# Patient Record
Sex: Male | Born: 1935 | Race: White | Hispanic: Refuse to answer | Marital: Married | Smoking: Former smoker
Health system: Southern US, Community
[De-identification: ages and names within clinical notes are randomized; demographics above are authoritative.]

## PROBLEM LIST (undated history)

## (undated) DIAGNOSIS — I10 Essential (primary) hypertension: Secondary | ICD-10-CM

## (undated) DIAGNOSIS — N189 Chronic kidney disease, unspecified: Secondary | ICD-10-CM

## (undated) DIAGNOSIS — J189 Pneumonia, unspecified organism: Secondary | ICD-10-CM

## (undated) DIAGNOSIS — C801 Malignant (primary) neoplasm, unspecified: Secondary | ICD-10-CM

## (undated) DIAGNOSIS — I219 Acute myocardial infarction, unspecified: Secondary | ICD-10-CM

## (undated) DIAGNOSIS — J45909 Unspecified asthma, uncomplicated: Secondary | ICD-10-CM

## (undated) DIAGNOSIS — C8590 Non-Hodgkin lymphoma, unspecified, unspecified site: Secondary | ICD-10-CM

## (undated) DIAGNOSIS — I2510 Atherosclerotic heart disease of native coronary artery without angina pectoris: Secondary | ICD-10-CM

## (undated) DIAGNOSIS — M1990 Unspecified osteoarthritis, unspecified site: Secondary | ICD-10-CM

## (undated) HISTORY — PX: APPENDECTOMY: SHX54

## (undated) HISTORY — PX: JOINT REPLACEMENT: SHX530

## (undated) HISTORY — PX: TONSILLECTOMY: SUR1361

## (undated) HISTORY — PX: EYE SURGERY: SHX253

## (undated) HISTORY — PX: NEPHRECTOMY: SHX65

## (undated) HISTORY — PX: CARDIAC CATHETERIZATION: SHX172

---

## 2019-06-19 ENCOUNTER — Encounter: Payer: Self-pay | Admitting: Physical Therapy

## 2019-06-19 ENCOUNTER — Other Ambulatory Visit: Payer: Self-pay

## 2019-06-19 ENCOUNTER — Ambulatory Visit: Payer: No Typology Code available for payment source | Attending: Neurosurgery | Admitting: Physical Therapy

## 2019-06-19 VITALS — BP 141/63 | HR 63

## 2019-06-19 DIAGNOSIS — R29898 Other symptoms and signs involving the musculoskeletal system: Secondary | ICD-10-CM | POA: Diagnosis present

## 2019-06-19 DIAGNOSIS — G8929 Other chronic pain: Secondary | ICD-10-CM | POA: Insufficient documentation

## 2019-06-19 DIAGNOSIS — M5441 Lumbago with sciatica, right side: Secondary | ICD-10-CM | POA: Insufficient documentation

## 2019-06-19 DIAGNOSIS — R262 Difficulty in walking, not elsewhere classified: Secondary | ICD-10-CM | POA: Insufficient documentation

## 2019-06-19 DIAGNOSIS — M5442 Lumbago with sciatica, left side: Secondary | ICD-10-CM | POA: Insufficient documentation

## 2019-06-19 NOTE — Therapy (Signed)
[facility] [address]  [place], [place], [address] Phone: [phone]   Fax:  [phone]  Physical Therapy Evaluation  Patient Details  Name: Craig Werner MRN: [id number] Date of Birth: 02/02/1934 Referring Provider (PT): [name], MD   Encounter Date: 06/19/2019  PT End of Session - 06/19/19 1545    Visit Number  1    Number of Visits  15    Date for PT Re-Evaluation  08/07/19    Authorization Type  [facility], VL: 15    Authorization - Visit Number  1    Authorization - Number of Visits  15    PT Start Time  1401    PT Stop Time  1444    PT Time Calculation (min)  43 min    Activity Tolerance  Patient tolerated treatment well;Patient limited by pain    Behavior During Therapy  [facility] for tasks assessed/performed       History reviewed. No pertinent past medical history.  History reviewed. No pertinent surgical history.  Vitals:   06/19/19 1422  BP: (!) 141/63  Pulse: 63  SpO2: 96%     Subjective Assessment - 06/19/19 1404    Subjective  Patient reporting LBP for the past year. Pain came on insidiously with progressive worsening. Occurs across B LB with radiation down B LEs down to proximal lower legs. Reports intermittent N/T. Denies recent bowel or bladder changes. Pain worse in the AM, when getting up out of bed, and with difficulty walking in the beginning of the day. Has been using SPC for the past 3-4 months d/t pain. No better after seeing chiropractor.    Pertinent History  hx prostate and kidney CA in remission, hx MI,    Limitations  Sitting;Lifting;Standing;Walking;House hold activities    How long can you sit comfortably?  1-2 hours    How long can you stand comfortably?  15-20 min    How long can you walk comfortably?  0 min    Diagnostic tests  06/08/19 lumbar MRI: moderately severe lumbar spinal stenosis L4-5, small disc protrusion L5-S1,small synovial cyst at L2    Patient Stated Goals   "i want to be able to walk again"    Currently in Pain?  Yes    Pain Score  2     Pain Location  Back    Pain Orientation  Right;Left;Lower    Pain Descriptors / Indicators  --   unable to describe   Pain Type  Chronic pain    Pain Radiating Towards  down into B LEs         [facility] PT Assessment - 06/19/19 1413      Assessment   Medical Diagnosis  Spinal Stenosis of Lumbar Region without Neurogenic Claudication    Referring Provider (PT)  [name], MD    Onset Date/Surgical Date  06/18/18    Next MD Visit  patient not sure    Prior Therapy  yes- for hips      Precautions   Precautions  None   hx of prostate of kidney CA     Balance Screen   Has the patient fallen in the past 6 months  No    Has the patient had a decrease in activity level because of a fear of falling?   No    Is the patient reluctant to leave their home because of a fear of falling?   No  Home Environment   Living Environment  Private residence    Living Arrangements  Spouse/significant other    Available Help at Discharge  Family    Type of [place] to enter    Entrance Stairs-Number of Steps  [place] to live on main level with bedroom/bathroom    [place] - 4 wheels      Prior Function   Level of [place]   had difficulty cooking   Vocation  Retired    Leisure  hunting, fishing, Arts administrator   Overall Cognitive Status  Within Functional Limits for tasks assessed      Sensation   Light Touch  Appears Intact      Coordination   Gross Motor Movements are Fluid and Coordinated  No   limited by pain     Posture/Postural Control   Posture/Postural Control  Postural limitations    Postural Limitations  Rounded Shoulders;Forward head;Increased thoracic kyphosis      ROM / Strength   AROM / PROM / Strength  AROM;Strength      AROM   AROM Assessment Site  Lumbar     Lumbar Flexion  mid shin   mild pain   Lumbar Extension  mildly limited   moderat epain   Lumbar - Right Side Bend   jt line   moderate pain   Lumbar - Left Side Bend  jt line   moderate pain   Lumbar - Right Rotation  WFL   moderate pain   Lumbar - Left Rotation  WFL   moderate pain     Strength   Strength Assessment Site  Hip;Knee;Ankle    Right/Left Hip  Right;Left    Right Hip Flexion  4+/5    Right Hip ABduction  4-/5    Right Hip ADduction  4+/5    Left Hip Flexion  4+/5    Left Hip ABduction  4+/5    Left Hip ADduction  4+/5    Right/Left Knee  Right;Left    Right Knee Flexion  4/5    Right Knee Extension  4/5    Left Knee Flexion  4/5    Left Knee Extension  4/5    Right/Left Ankle  Right;Left    Right Ankle Dorsiflexion  4+/5    Right Ankle Plantar Flexion  4+/5    Left Ankle Dorsiflexion  4+/5    Left Ankle Plantar Flexion  4+/5      Flexibility   Soft Tissue Assessment /Muscle Length  yes    Hamstrings  B severely tight    Quadriceps  R hip flexor mod tight, L mildly tight    Piriformis  B mod tight piriformis with fig 4, less so with KTOS      Palpation   Palpation comment  atrophy of R glute med; TTP over R lumbar paraspinals at level of L3-5, TTP and increased soft tissue restriction of B proximal glutes and R piriformis      Ambulation/Gait   Assistive device  Straight cane    Gait Pattern  Step-to pattern;Step-through pattern;Decreased step length - right;Decreased step length - left;Lateral trunk lean to left;Lateral trunk lean to right;Lateral hip instability    Ambulation Surface  Level;Indoor    Gait velocity  decreased  Objective measurements completed on examination: See above findings.              PT Education - 06/19/19 1545    Education Details  prognosis, POC, HEP    Person(s) Educated  Patient    Methods  Explanation;Demonstration;Tactile cues;Verbal cues;Handout    Comprehension  Verbalized  understanding;Returned demonstration       PT Short Term Goals - 06/19/19 1553      PT SHORT TERM GOAL #1   Title  Patient to be independent with initial HEP.    Time  3    Period  Weeks    Status  New    Target Date  07/10/19        PT Long Term Goals - 06/19/19 1554      PT LONG TERM GOAL #1   Title  Patient to be independent with advanced HEP.    Time  7    Period  Weeks    Status  New    Target Date  08/07/19      PT LONG TERM GOAL #2   Title  Patient to demonstrate B hip strength >/=4+/5.    Time  7    Period  Weeks    Status  New    Target Date  08/07/19      PT LONG TERM GOAL #3   Title  Patient to demonstrate lumbar AROM WFL and without pain limiting.    Time  7    Period  Weeks    Status  New    Target Date  08/07/19      PT LONG TERM GOAL #4   Title  Patient to report 75% improvement in pain levels in AM.    Time  7    Period  Weeks    Status  New    Target Date  08/07/19      PT LONG TERM GOAL #5   Title  Patient to report tolerance of 30 min of walking without pain limiting.    Time  7    Period  Weeks    Status  New    Target Date  08/07/19             Plan - 06/19/19 1546    Clinical Impression Statement  Patient is a 86y/o M presenting to OPPT with c/o B LBP with radiation down B LEs for the past year. Radiation occurs down to proximal lower legs with intermittent N/T. Denies recent bowel or bladder changes. Pain worse in the AM, when getting up out of bed, and with difficulty walking in the beginning of the day. Has been using SPC for the past 3-4 months d/t pain. Patient today presenting with R hip abduction weakness, mildly limited but painful lumbar AROM, considerable tightness in B LEs, gait deviations, visible atrophy or R glute med, and TTP over R lumbar paraspinals, B proximal glutes, and R piriformis. Educated patient on gentle stretching HEP- patient reported understanding. Would benefit form skilled PT services 2x/week for 8  weeks to address aforementioned impairments.    Personal Factors and Comorbidities  Age;Comorbidity 3+;Time since onset of injury/illness/exacerbation;Fitness;Past/Current Experience    Comorbidities  CKD, HTN, LBP, HLD, R hip OA, hx prostate and kidney CA in remission    Examination-Activity Limitations  Bathing;Sit;Bed Mobility;Sleep;Bend;Squat;Caring for Others;Stairs;Carry;Stand;Continence;Toileting;Dressing;Transfers;Hygiene/Grooming;Lift;Locomotion Level;Reach Overhead    Examination-Participation Restrictions  Church;School;Cleaning;Shop;Community Activity;Driving;Yard Work;Interpersonal Relationship;Laundry;Meal Prep    Stability/Clinical Decision Making  Evolving/Moderate complexity    Clinical Decision Making  Moderate    Rehab Potential  Good    PT Frequency  2x / week    PT Duration  --   2x7 weeks   PT Treatment/Interventions  ADLs/Self Care Home Management;Canalith Repostioning;Cryotherapy;Moist Heat;Traction;Balance training;Therapeutic exercise;Therapeutic activities;Functional mobility training;Stair training;Gait training;DME Instruction;Neuromuscular re-education;Patient/family education;Manual techniques;Taping;Energy conservation;Dry needling;Passive range of motion    PT Next Visit Plan  FOTO; reassess HEP    Consulted and Agree with Plan of Care  Patient       Patient will benefit from skilled therapeutic intervention in order to improve the following deficits and impairments:  Abnormal gait, Decreased activity tolerance, Decreased strength, Increased fascial restricitons, Pain, Increased muscle spasms, Difficulty walking, Decreased range of motion, Improper body mechanics, Postural dysfunction, Impaired flexibility, Decreased balance  Visit Diagnosis: Chronic bilateral low back pain with bilateral sciatica  Difficulty in walking, not elsewhere classified  Impaired flexibility of lower extremity     Problem List There are no problems to display for this  patient.    Janene Harvey, PT, DPT 06/19/19 3:59 PM   [facility] [address]  [place], [place], [address] Phone: [phone]   Fax:  [phone]  Name: Craig Werner MRN: [id number] Date of Birth: [birth year]

## 2019-06-21 ENCOUNTER — Encounter: Payer: Self-pay | Admitting: Physical Therapy

## 2019-06-21 ENCOUNTER — Other Ambulatory Visit: Payer: Self-pay

## 2019-06-21 ENCOUNTER — Ambulatory Visit: Payer: No Typology Code available for payment source | Admitting: Physical Therapy

## 2019-06-21 VITALS — BP 131/66

## 2019-06-21 DIAGNOSIS — M5442 Lumbago with sciatica, left side: Secondary | ICD-10-CM | POA: Diagnosis not present

## 2019-06-21 DIAGNOSIS — G8929 Other chronic pain: Secondary | ICD-10-CM

## 2019-06-21 DIAGNOSIS — R262 Difficulty in walking, not elsewhere classified: Secondary | ICD-10-CM

## 2019-06-21 DIAGNOSIS — R29898 Other symptoms and signs involving the musculoskeletal system: Secondary | ICD-10-CM

## 2019-06-21 DIAGNOSIS — M5441 Lumbago with sciatica, right side: Secondary | ICD-10-CM

## 2019-06-21 NOTE — Therapy (Signed)
[facility] [address]  [place], [place], [address] Phone: [phone]   Fax:  [phone]  Physical Therapy Treatment  Patient Details  Name: Craig Werner MRN: [id number] Date of Birth: December 28, 1933 Referring Provider (PT): [name], MD   Encounter Date: 06/21/2019  PT End of Session - 06/21/19 1200    Visit Number  2    Number of Visits  15    Date for PT Re-Evaluation  08/07/19    Authorization Type  VA, VL: 15    Authorization - Visit Number  2    Authorization - Number of Visits  15    PT Start Time  1109    PT Stop Time  1152    PT Time Calculation (min)  43 min    Activity Tolerance  Patient tolerated treatment well    Behavior During Therapy  [facility] for tasks assessed/performed       History reviewed. No pertinent past medical history.  History reviewed. No pertinent surgical history.  Vitals:   06/21/19 1110  BP: 131/66  SpO2: 99%    Subjective Assessment - 06/21/19 1113    Subjective  Apologetic for being late- got caught in traffic. Having some soreness with his HEP exercises but has been trying to do them.    Pertinent History  hx prostate and kidney CA in remission, hx MI,    Diagnostic tests  06/08/19 lumbar MRI: moderately severe lumbar spinal stenosis L4-5, small disc protrusion L5-S1,small synovial cyst at L2    Patient Stated Goals  "i want to be able to walk again"    Currently in Pain?  No/denies         [facility] PT Assessment - 06/21/19 0001      Observation/Other Assessments   Focus on Therapeutic Outcomes (FOTO)   Lumbar: 47 (53% limited, 44% predicted)                   [facility] Adult PT Treatment/Exercise - 06/21/19 0001      Exercises   Exercises  Lumbar;Knee/Hip      Lumbar Exercises: Stretches   Passive Hamstring Stretch  Right;Left;1 rep;30 seconds    Passive Hamstring Stretch Limitations  supine with strap    Single Knee to Chest Stretch  Right;Left;1  rep;30 seconds    Single Knee to Chest Stretch Limitations  good tolerance    Hip Flexor Stretch  Right;Left;2 reps;30 seconds    Hip Flexor Stretch Limitations  mod thomas stretch with foot resting on 6" step on R, 4" step on L   R tighter   Figure 4 Stretch  1 rep;30 seconds;Supine;With overpressure    Figure 4 Stretch Limitations  B sides; to tolerance   cues to avoid spine rotation; L tighter     Lumbar Exercises: Aerobic   Nustep  L3x 70min (UEs/LEs)      Lumbar Exercises: Supine   Bridge  10 reps    Bridge Limitations  cues for core and glute contraction as well as correction to R knee inward collapse   limited ROM; stopped to stretch L HS d/t cramping            PT Education - 06/21/19 1153    Education Details  update & review of HEP    Person(s) Educated  Patient    Methods  Explanation;Demonstration;Tactile cues;Verbal cues;Handout    Comprehension  Verbalized understanding;Returned demonstration       PT Short  Term Goals - 06/21/19 1203      PT SHORT TERM GOAL #1   Title  Patient to be independent with initial HEP.    Time  3    Period  Weeks    Status  On-going    Target Date  07/10/19        PT Long Term Goals - 06/21/19 1204      PT LONG TERM GOAL #1   Title  Patient to be independent with advanced HEP.    Time  7    Period  Weeks    Status  On-going      PT LONG TERM GOAL #2   Title  Patient to demonstrate B hip strength >/=4+/5.    Time  7    Period  Weeks    Status  On-going      PT LONG TERM GOAL #3   Title  Patient to demonstrate lumbar AROM WFL and without pain limiting.    Time  7    Period  Weeks    Status  On-going      PT LONG TERM GOAL #4   Title  Patient to report 75% improvement in pain levels in AM.    Time  7    Period  Weeks    Status  On-going      PT LONG TERM GOAL #5   Title  Patient to report tolerance of 30 min of walking without pain limiting.    Time  7    Period  Weeks    Status  On-going             Plan - 06/21/19 1200    Clinical Impression Statement  Patient reporting "soreness" with HEP exercises. Thus, reviewed stretches for improved carryover. Patient noting that he forgot about figure 4 stretch, requiring additional demonstration and review. Demonstrated increased tightness on L LE with figure 4 stretch, but with increased tightness on R LE with hip flexor stretch. Able to initiate bridges with cues to contract core and glutes and correct valgus collapse of R LE. Plan to add banded resistance to this exercise in the future to improve patient's abduction strength here. Updated HEP with modified hip flexor stretch as it was well-tolerated today. Patient reported understanding and without complaints at end of session.    Comorbidities  CKD, HTN, LBP, HLD, R hip OA, hx prostate and kidney CA in remission    PT Treatment/Interventions  ADLs/Self Care Home Management;Canalith Repostioning;Cryotherapy;Moist Heat;Traction;Balance training;Therapeutic exercise;Therapeutic activities;Functional mobility training;Stair training;Gait training;DME Instruction;Neuromuscular re-education;Patient/family education;Manual techniques;Taping;Energy conservation;Dry needling;Passive range of motion    PT Next Visit Plan  progress LE stretching and hip strength    Consulted and Agree with Plan of Care  Patient       Patient will benefit from skilled therapeutic intervention in order to improve the following deficits and impairments:  Abnormal gait, Decreased activity tolerance, Decreased strength, Increased fascial restricitons, Pain, Increased muscle spasms, Difficulty walking, Decreased range of motion, Improper body mechanics, Postural dysfunction, Impaired flexibility, Decreased balance  Visit Diagnosis: Chronic bilateral low back pain with bilateral sciatica  Difficulty in walking, not elsewhere classified  Impaired flexibility of lower extremity     Problem List There are no  problems to display for this patient.    Janene Harvey, PT, DPT 06/21/19 12:05 PM   [facility] [address]  [address] [place], [place], [address] Phone: [phone]   Fax:  [phone]  Name: Craig Werner MRN: [id number] Date of Birth: [birth year]

## 2019-06-24 ENCOUNTER — Ambulatory Visit: Payer: No Typology Code available for payment source | Admitting: Physical Therapy

## 2019-06-24 ENCOUNTER — Other Ambulatory Visit: Payer: Self-pay

## 2019-06-24 ENCOUNTER — Encounter: Payer: Self-pay | Admitting: Physical Therapy

## 2019-06-24 VITALS — BP 142/68 | HR 84

## 2019-06-24 DIAGNOSIS — R262 Difficulty in walking, not elsewhere classified: Secondary | ICD-10-CM

## 2019-06-24 DIAGNOSIS — R29898 Other symptoms and signs involving the musculoskeletal system: Secondary | ICD-10-CM

## 2019-06-24 DIAGNOSIS — G8929 Other chronic pain: Secondary | ICD-10-CM

## 2019-06-24 DIAGNOSIS — M5442 Lumbago with sciatica, left side: Secondary | ICD-10-CM | POA: Diagnosis not present

## 2019-06-24 NOTE — Therapy (Signed)
[facility] [address]  [place], [place], [address] Phone: [phone]   Fax:  [phone]  Physical Therapy Treatment  Patient Details  Name: Craig Werner MRN: [id number] Date of Birth: 31-Aug-1933 Referring Provider (PT): [name], MD   Encounter Date: 06/24/2019  PT End of Session - 06/24/19 1004    Visit Number  3    Number of Visits  15    Date for PT Re-Evaluation  08/07/19    Authorization Type  VA, VL: 38    Authorization - Visit Number  3    Authorization - Number of Visits  15    PT Start Time  [phone]    PT Stop Time  0930    PT Time Calculation (min)  41 min    Activity Tolerance  Patient tolerated treatment well    Behavior During Therapy  [facility] for tasks assessed/performed       History reviewed. No pertinent past medical history.  History reviewed. No pertinent surgical history.  Vitals:   06/24/19 0850 06/24/19 0852  BP: (!) 142/68   Pulse: 99 84  SpO2: 95%     Subjective Assessment - 06/24/19 0853    Subjective  Feeling good. Exercises are alright, not great. Feels like a bit of a strain.    Pertinent History  hx prostate and kidney CA in remission, hx MI,    Diagnostic tests  06/08/19 lumbar MRI: moderately severe lumbar spinal stenosis L4-5, small disc protrusion L5-S1,small synovial cyst at L2    Patient Stated Goals  "i want to be able to walk again"    Currently in Pain?  No/denies                       [facility] Adult PT Treatment/Exercise - 06/24/19 0001      Ambulation/Gait   Ambulation Distance (Feet)  150 Feet    Assistive device  Straight cane    Gait Pattern  Step-to pattern;Step-through pattern;Decreased step length - right;Decreased step length - left;Lateral trunk lean to left;Lateral trunk lean to right;Lateral hip instability    Ambulation Surface  Level;Indoor    Gait velocity  decreased    Gait Comments  gait training with adjustment in [place] height  and practice using cane in R/L side and edu on cane sequencing for most appropriate pattern for max stability    most stable with SPC in L hand     Lumbar Exercises: Stretches   Passive Hamstring Stretch  Right;Left;1 rep;30 seconds    Passive Hamstring Stretch Limitations  supine with strap    Figure 4 Stretch  1 rep;30 seconds;Supine;With overpressure    Figure 4 Stretch Limitations  B sides; to tolerance      Lumbar Exercises: Aerobic   Nustep  L3x 65min (UEs/LEs)   spO2 93%, 98bpm at 3 min mark     Lumbar Exercises: Supine   Clam  10 reps    Clam Limitations  with yellow TB   cues to increase ROM on R   Bridge with clamshell  10 reps   yellow TB above knees     Knee/Hip Exercises: Sidelying   Clams  x10 each side    cues to avoid trunk rotation; discontinued d/t R hip pain             PT Education - 06/24/19 0938    Education Details  update to HEP    Person(s)  Educated  Patient    Methods  Explanation;Demonstration;Tactile cues;Verbal cues;Handout    Comprehension  Verbalized understanding;Returned demonstration       PT Short Term Goals - 06/21/19 1203      PT SHORT TERM GOAL #1   Title  Patient to be independent with initial HEP.    Time  3    Period  Weeks    Status  On-going    Target Date  07/10/19        PT Long Term Goals - 06/21/19 1204      PT LONG TERM GOAL #1   Title  Patient to be independent with advanced HEP.    Time  7    Period  Weeks    Status  On-going      PT LONG TERM GOAL #2   Title  Patient to demonstrate B hip strength >/=4+/5.    Time  7    Period  Weeks    Status  On-going      PT LONG TERM GOAL #3   Title  Patient to demonstrate lumbar AROM WFL and without pain limiting.    Time  7    Period  Weeks    Status  On-going      PT LONG TERM GOAL #4   Title  Patient to report 75% improvement in pain levels in AM.    Time  7    Period  Weeks    Status  On-going      PT LONG TERM GOAL #5   Title  Patient to report  tolerance of 30 min of walking without pain limiting.    Time  7    Period  Weeks    Status  On-going            Plan - 06/24/19 1004    Clinical Impression Statement  Patient without new complaints today. Began session with adjustment of SPC height and educated patient on proper cane sequencing to avoid fall. Patient performed gait training with practice using SPC in R and L sides, with L side ultimately demonstrating more stability and better benefit to R LE which is more painful. Continued with review of LE stretching with minor cues for form. Patient demonstrated hip strengthening ther-ex for focus on hip rotators and abductors. Patient reported muscle fatigue and required cues to increase ROM on R LE consistently. Updated HEP with supine clamshell as this was well-tolerated today. Advised patient to practice new SPC sequencing pattern at home before using it out in the community in order to avoid LOB. Patient reported understanding and without complaints at end of session.    Comorbidities  CKD, HTN, LBP, HLD, R hip OA, hx prostate and kidney CA in remission    PT Treatment/Interventions  ADLs/Self Care Home Management;Canalith Repostioning;Cryotherapy;Moist Heat;Traction;Balance training;Therapeutic exercise;Therapeutic activities;Functional mobility training;Stair training;Gait training;DME Instruction;Neuromuscular re-education;Patient/family education;Manual techniques;Taping;Energy conservation;Dry needling;Passive range of motion    PT Next Visit Plan  progress LE stretching and hip strength    Consulted and Agree with Plan of Care  Patient       Patient will benefit from skilled therapeutic intervention in order to improve the following deficits and impairments:  Abnormal gait, Decreased activity tolerance, Decreased strength, Increased fascial restricitons, Pain, Increased muscle spasms, Difficulty walking, Decreased range of motion, Improper body mechanics, Postural dysfunction,  Impaired flexibility, Decreased balance  Visit Diagnosis: Chronic bilateral low back pain with bilateral sciatica  Difficulty in walking, not elsewhere classified  Impaired flexibility of  lower extremity     Problem List There are no problems to display for this patient.    Janene Harvey, PT, DPT 06/24/19 10:19 AM   [facility] [address]  [place], [place], [address] Phone: [phone]   Fax:  [phone]  Name: COLUM SERRETTE MRN: [id number] Date of Birth: 1934-01-28

## 2019-06-26 ENCOUNTER — Encounter: Payer: Self-pay | Admitting: Physical Therapy

## 2019-06-26 ENCOUNTER — Ambulatory Visit: Payer: No Typology Code available for payment source | Admitting: Physical Therapy

## 2019-06-26 ENCOUNTER — Other Ambulatory Visit: Payer: Self-pay

## 2019-06-26 VITALS — BP 149/70 | HR 90

## 2019-06-26 DIAGNOSIS — R262 Difficulty in walking, not elsewhere classified: Secondary | ICD-10-CM

## 2019-06-26 DIAGNOSIS — R29898 Other symptoms and signs involving the musculoskeletal system: Secondary | ICD-10-CM

## 2019-06-26 DIAGNOSIS — M5442 Lumbago with sciatica, left side: Secondary | ICD-10-CM

## 2019-06-26 DIAGNOSIS — G8929 Other chronic pain: Secondary | ICD-10-CM

## 2019-06-26 NOTE — Therapy (Signed)
[facility] [address]  [place], [place], [address] Phone: [phone]   Fax:  [phone]  Physical Therapy Treatment  Patient Details  Name: Craig Werner MRN: [id number] Date of Birth: 12-Apr-1934 Referring Provider (PT): [name], MD   Encounter Date: 06/26/2019  PT End of Session - 06/26/19 1355    Visit Number  4    Number of Visits  15    Date for PT Re-Evaluation  08/07/19    Authorization Type  VA, VL: 15    Authorization - Visit Number  4    Authorization - Number of Visits  15    PT Start Time  [id number]    PT Stop Time  [id number]    PT Time Calculation (min)  42 min    Activity Tolerance  Patient tolerated treatment well    Behavior During Therapy  [facility] for tasks assessed/performed       History reviewed. No pertinent past medical history.  History reviewed. No pertinent surgical history.  Vitals:   06/26/19 1312  BP: (!) 149/70  Pulse: 90  SpO2: 98%    Subjective Assessment - 06/26/19 1308    Subjective  Back is feeling a little better today than it was. Has tried to walk with the [facility] using an alternating pattern as instructed, but still feeling like it is challenging.    Pertinent History  hx prostate and kidney CA in remission, hx MI,    Diagnostic tests  06/08/19 lumbar MRI: moderately severe lumbar spinal stenosis L4-5, small disc protrusion L5-S1,small synovial cyst at L2    Patient Stated Goals  "i want to be able to walk again"    Currently in Pain?  No/denies                       [facility] Adult PT Treatment/Exercise - 06/26/19 0001      Ambulation/Gait   Ambulation Distance (Feet)  90 Feet    Assistive device  Straight cane    Gait Pattern  Step-through pattern;Decreased step length - right;Decreased step length - left;Lateral trunk lean to left;Lateral trunk lean to right;Lateral hip instability    Ambulation Surface  Level;Indoor    Gait velocity  decreased    Gait  Comments  gait training with SPC in L hand with cues for cane sequencing and CGA/min A requred intermittent to assist with balance recovery      Lumbar Exercises: Aerobic   Nustep  L3x 3min (LEs only)      Lumbar Exercises: Supine   Bridge with clamshell  10 reps   2x10; yellow TB around knees     Knee/Hip Exercises: Stretches   Hip Flexor Stretch  Right;Left;1 rep;30 seconds    Piriformis Stretch  Right;Left;1 rep;30 seconds    Piriformis Stretch Limitations  supine figure 4 to tolerance      Knee/Hip Exercises: Seated   Sit to Sand  2 sets;5 reps;without UE support   5x with ball squeeze, 5x with yellow TB above knees            PT Education - 06/26/19 1355    Education Details  update to HEP; advised to omit supine hip flexor stretch    Person(s) Educated  Patient    Methods  Explanation;Demonstration;Tactile cues;Verbal cues;Handout    Comprehension  Verbalized understanding;Returned demonstration       PT Short Term Goals - 06/26/19 1402  PT SHORT TERM GOAL #1   Title  Patient to be independent with initial HEP.    Time  3    Period  Weeks    Status  Achieved    Target Date  07/10/19        PT Long Term Goals - 06/21/19 1204      PT LONG TERM GOAL #1   Title  Patient to be independent with advanced HEP.    Time  7    Period  Weeks    Status  On-going      PT LONG TERM GOAL #2   Title  Patient to demonstrate B hip strength >/=4+/5.    Time  7    Period  Weeks    Status  On-going      PT LONG TERM GOAL #3   Title  Patient to demonstrate lumbar AROM WFL and without pain limiting.    Time  7    Period  Weeks    Status  On-going      PT LONG TERM GOAL #4   Title  Patient to report 75% improvement in pain levels in AM.    Time  7    Period  Weeks    Status  On-going      PT LONG TERM GOAL #5   Title  Patient to report tolerance of 30 min of walking without pain limiting.    Time  7    Period  Weeks    Status  On-going             Plan - 06/26/19 1356    Clinical Impression Statement  Patient reporting good improvement in LBP today. Still noting difficulty with SPC sequencing using alternating step-through pattern as instructed at last session. Reviewed gait training with SPC in L hand, with cues for cane sequencing and CGA/min A required intermittently to assist with balance recovery. Patient demonstrating much improved gait speed and continuity of stepping with this pattern, despite being slightly off balance. Advised patient to continue practicing this walking pattern at home first to improve stability. Patient reported understanding. Continued to work on bridges with resistance in abduction with patient still demonstrating valgus collapse, worse on R. Initiated STS with patient requiring cues for set up and to avoid pushing bottom back posteriorly and locking out knees before standing up. Patient required pushing off from legs, but with good response to cues. Updated HEP with sitting hip flexor stretch, omitting supine version d/t patient's report of difficulty. Patient reported understanding-. No complaints at end of session.    Comorbidities  CKD, HTN, LBP, HLD, R hip OA, hx prostate and kidney CA in remission    PT Treatment/Interventions  ADLs/Self Care Home Management;Canalith Repostioning;Cryotherapy;Moist Heat;Traction;Balance training;Therapeutic exercise;Therapeutic activities;Functional mobility training;Stair training;Gait training;DME Instruction;Neuromuscular re-education;Patient/family education;Manual techniques;Taping;Energy conservation;Dry needling;Passive range of motion    PT Next Visit Plan  progress LE stretching and hip strength; STS    Consulted and Agree with Plan of Care  Patient       Patient will benefit from skilled therapeutic intervention in order to improve the following deficits and impairments:  Abnormal gait, Decreased activity tolerance, Decreased strength, Increased fascial  restricitons, Pain, Increased muscle spasms, Difficulty walking, Decreased range of motion, Improper body mechanics, Postural dysfunction, Impaired flexibility, Decreased balance  Visit Diagnosis: Chronic bilateral low back pain with bilateral sciatica  Difficulty in walking, not elsewhere classified  Impaired flexibility of lower extremity     Problem List There are  no problems to display for this patient.    Janene Harvey, PT, DPT 06/26/19 2:08 PM   [facility] [address]  [place], [place], [address] Phone: [phone]   Fax:  [phone]  Name: Craig Werner MRN: [id number] Date of Birth: 04-07-1934

## 2019-07-01 ENCOUNTER — Other Ambulatory Visit: Payer: Self-pay

## 2019-07-01 ENCOUNTER — Ambulatory Visit: Payer: No Typology Code available for payment source | Attending: Neurosurgery | Admitting: Physical Therapy

## 2019-07-01 ENCOUNTER — Encounter: Payer: Self-pay | Admitting: Physical Therapy

## 2019-07-01 VITALS — BP 137/63 | HR 75

## 2019-07-01 DIAGNOSIS — M5442 Lumbago with sciatica, left side: Secondary | ICD-10-CM | POA: Diagnosis not present

## 2019-07-01 DIAGNOSIS — R262 Difficulty in walking, not elsewhere classified: Secondary | ICD-10-CM | POA: Insufficient documentation

## 2019-07-01 DIAGNOSIS — M5441 Lumbago with sciatica, right side: Secondary | ICD-10-CM | POA: Diagnosis present

## 2019-07-01 DIAGNOSIS — R29898 Other symptoms and signs involving the musculoskeletal system: Secondary | ICD-10-CM | POA: Diagnosis present

## 2019-07-01 DIAGNOSIS — G8929 Other chronic pain: Secondary | ICD-10-CM | POA: Diagnosis present

## 2019-07-01 NOTE — Therapy (Signed)
[facility] [address]  [place], [place], [address] Phone: [phone]   Fax:  [phone]  Physical Therapy Treatment  Patient Details  Name: Craig Werner MRN: [id number] Date of Birth: 10-May-1934 Referring Provider (PT): [name], MD   Encounter Date: 07/01/2019  PT End of Session - 07/01/19 1150    Visit Number  5    Number of Visits  15    Date for PT Re-Evaluation  08/07/19    Authorization Type  VA, VL: 10    Authorization - Visit Number  5    Authorization - Number of Visits  15    PT Start Time  1100    PT Stop Time  [id number]    PT Time Calculation (min)  44 min    Activity Tolerance  Patient tolerated treatment well    Behavior During Therapy  [facility] for tasks assessed/performed       History reviewed. No pertinent past medical history.  History reviewed. No pertinent surgical history.  Vitals:   07/01/19 1101  BP: 137/63  Pulse: 75  SpO2: 96%    Subjective Assessment - 07/01/19 1104    Subjective  Back has been feeling a little bit better. Notes that it is hard to switch using his cane in his L hand while practicing at home.    Pertinent History  hx prostate and kidney CA in remission, hx MI,    Diagnostic tests  06/08/19 lumbar MRI: moderately severe lumbar spinal stenosis L4-5, small disc protrusion L5-S1,small synovial cyst at L2    Patient Stated Goals  "i want to be able to walk again"    Currently in Pain?  No/denies                       [facility] Adult PT Treatment/Exercise - 07/01/19 0001      Ambulation/Gait   Ambulation Distance (Feet)  180 Feet    Assistive device  Straight cane    Gait Pattern  Step-through pattern;Decreased step length - right;Decreased step length - left;Lateral trunk lean to left;Lateral trunk lean to right;Lateral hip instability    Ambulation Surface  Level;Indoor    Gait velocity  decreased    Gait Comments  gait training with SPC in L  hand practicing cane sequencing and cane placement   shortened cane slightly for improved stability     Lumbar Exercises: Stretches   Hip Flexor Stretch  Right;Left;30 seconds;1 rep    Hip Flexor Stretch Limitations  sitting off side of chair   slight cues for positioning   Standing Extension  5 reps    Standing Extension Limitations  perfoming with hands against wall and with supervision   pr reporting relief   Other Lumbar Stretch Exercise  sitting thoracic extension 5x sitting on pillow   report of stretch in anterior hips   Other Lumbar Stretch Exercise  lumbar prayer stretch 5x5" forward & to either side- discontonued d/t pt reporting no stretch      Lumbar Exercises: Aerobic   Stationary Bike       Nustep  L4x 38min (LEs only)      Knee/Hip Exercises: Standing   Hip Abduction  Stengthening;Right;Left;1 set;10 reps;Knee straight    Abduction Limitations  at counter; limited ROM, more difficulty on R LE    Hip Extension  Stengthening;Right;Left;1 set;10 reps;Knee straight    Extension Limitations  at counter; limited ROM & difficulty maintaining  straight knees    Functional Squat  1 set;10 reps    Functional Squat Limitations  mini squat at counter   cues to shift hips back     Knee/Hip Exercises: Seated   Sit to Sand  5 reps;without UE support;1 set   much improved control            PT Education - 07/01/19 1149    Education Details  update & review of HEP for improved understanding    Person(s) Educated  Patient    Methods  Explanation;Demonstration;Tactile cues;Verbal cues;Handout    Comprehension  Verbalized understanding;Returned demonstration       PT Short Term Goals - 06/26/19 1402      PT SHORT TERM GOAL #1   Title  Patient to be independent with initial HEP.    Time  3    Period  Weeks    Status  Achieved    Target Date  07/10/19        PT Long Term Goals - 06/21/19 1204      PT LONG TERM GOAL #1   Title  Patient to be independent with  advanced HEP.    Time  7    Period  Weeks    Status  On-going      PT LONG TERM GOAL #2   Title  Patient to demonstrate B hip strength >/=4+/5.    Time  7    Period  Weeks    Status  On-going      PT LONG TERM GOAL #3   Title  Patient to demonstrate lumbar AROM WFL and without pain limiting.    Time  7    Period  Weeks    Status  On-going      PT LONG TERM GOAL #4   Title  Patient to report 75% improvement in pain levels in AM.    Time  7    Period  Weeks    Status  On-going      PT LONG TERM GOAL #5   Title  Patient to report tolerance of 30 min of walking without pain limiting.    Time  7    Period  Weeks    Status  On-going            Plan - 07/01/19 1150    Clinical Impression Statement  Patient reporting continued improvement in LBP, but still noticing some difficulty when practicing using [place] in L hand while at home. Reviewed HEP handouts for improved compliance and understanding, with patient only requiring minor cues for positioning with sitting hip flexor stretch. Continued with gait training with SPC height lowered for improved stability. Patient still voluntarily using step-to pattern, leading with R LE but able to switch to reciprocal alternating pattern when prompted. Introduced standing hip strengthening ther-ex with patient requiring cues for posture and form. Reported 3/10 tightness in back after performing standing ther-ex, with most relief with standing lumbar extension stretch. Demonstrated good improvement in control and form with STS today, thus this was added into HEP. Patient reported understanding. No complaints at end of session. Patient demonstrating good progress in exercise tolerance.    Comorbidities  CKD, HTN, LBP, HLD, R hip OA, hx prostate and kidney CA in remission    PT Treatment/Interventions  ADLs/Self Care Home Management;Canalith Repostioning;Cryotherapy;Moist Heat;Traction;Balance training;Therapeutic exercise;Therapeutic  activities;Functional mobility training;Stair training;Gait training;DME Instruction;Neuromuscular re-education;Patient/family education;Manual techniques;Taping;Energy conservation;Dry needling;Passive range of motion    PT Next Visit Plan  progress LE  stretching and hip strength; STS    Consulted and Agree with Plan of Care  Patient       Patient will benefit from skilled therapeutic intervention in order to improve the following deficits and impairments:  Abnormal gait, Decreased activity tolerance, Decreased strength, Increased fascial restricitons, Pain, Increased muscle spasms, Difficulty walking, Decreased range of motion, Improper body mechanics, Postural dysfunction, Impaired flexibility, Decreased balance  Visit Diagnosis: Chronic bilateral low back pain with bilateral sciatica  Difficulty in walking, not elsewhere classified  Impaired flexibility of lower extremity     Problem List There are no problems to display for this patient.    Janene Harvey, PT, DPT 07/01/19 11:57 AM   [facility] [address]  [place], [place], [address] Phone: [phone]   Fax:  [phone]  Name: Craig Werner MRN: [id number] Date of Birth: 1933/07/05

## 2019-07-03 ENCOUNTER — Encounter: Payer: Self-pay | Admitting: Physical Therapy

## 2019-07-03 ENCOUNTER — Other Ambulatory Visit: Payer: Self-pay

## 2019-07-03 ENCOUNTER — Ambulatory Visit: Payer: No Typology Code available for payment source | Admitting: Physical Therapy

## 2019-07-03 VITALS — BP 139/68 | HR 75

## 2019-07-03 DIAGNOSIS — G8929 Other chronic pain: Secondary | ICD-10-CM

## 2019-07-03 DIAGNOSIS — R29898 Other symptoms and signs involving the musculoskeletal system: Secondary | ICD-10-CM

## 2019-07-03 DIAGNOSIS — M5442 Lumbago with sciatica, left side: Secondary | ICD-10-CM

## 2019-07-03 DIAGNOSIS — R262 Difficulty in walking, not elsewhere classified: Secondary | ICD-10-CM

## 2019-07-03 NOTE — Therapy (Signed)
[facility] [address]  [place], [place], [address] Phone: [phone]   Fax:  [phone]  Physical Therapy Treatment  Patient Details  Name: Craig Werner MRN: [id number] Date of Birth: Oct 24, 1933 Referring Provider (PT): [name], MD   Encounter Date: 07/03/2019  PT End of Session - 07/03/19 1151    Visit Number  6    Number of Visits  15    Date for PT Re-Evaluation  08/07/19    Authorization Type  VA, VL: 107    Authorization - Visit Number  6    Authorization - Number of Visits  15    PT Start Time  [id number]    PT Stop Time  [id number]    PT Time Calculation (min)  48 min    Activity Tolerance  Patient tolerated treatment well;Patient limited by pain    Behavior During Therapy  [facility] for tasks assessed/performed       History reviewed. No pertinent past medical history.  History reviewed. No pertinent surgical history.  Vitals:   07/03/19 1058  BP: 139/68  Pulse: 75  SpO2: 100%    Subjective Assessment - 07/03/19 1100    Subjective  Not much is new. Finally got an appointment to get a COVID vaccine. Has an appointment for spinal injections on 07/12/19. Notes that he is able to walk with less pain on average, but pain still fluctuates.    Pertinent History  hx prostate and kidney CA in remission, hx MI,    Diagnostic tests  06/08/19 lumbar MRI: moderately severe lumbar spinal stenosis L4-5, small disc protrusion L5-S1,small synovial cyst at L2    Patient Stated Goals  "i want to be able to walk again"    Currently in Pain?  No/denies                       [facility] Adult PT Treatment/Exercise - 07/03/19 0001      Lumbar Exercises: Stretches   Piriformis Stretch  Right;Left;30 seconds;2 reps    Piriformis Stretch Limitations  sitting KTOS    Figure 4 Stretch  30 seconds;With overpressure;Seated;2 reps    Figure 4 Stretch Limitations  B sides; to tolerance      Lumbar Exercises: Aerobic    Nustep  L4x 66min (LEs only)      Knee/Hip Exercises: Standing   Hip Abduction  Stengthening;Right;Left;10 reps;Knee straight;2 sets    Abduction Limitations  at counter; 1#   lateral trunk lean; cues to increase eccentric control   Hip Extension  Stengthening;Right;Left;1 set;10 reps;Knee straight    Extension Limitations  at counter top; 1#   cues to depress shoulders and maintain straight knee   Other Standing Knee Exercises  R LE hip hike at counter top on 6" step x5 each side with CGA   c/o B hip fatigue   Other Standing Knee Exercises  sidestepping with yellow TB around ankles 2 sets of 2x length of counter   small steps and lateral trunk lean     Manual Therapy   Manual Therapy  Soft tissue mobilization;Myofascial release    Manual therapy comments  L sidelying    Soft tissue mobilization  STM to R proximal glutes, proximal lateral HS and along ITB- TTP throughout and palpable soft tissue restriction    Myofascial Release  manual TPR to R proximal glutes and proximal lateral HS- many palpable and tender trigger points  PT Education - 07/03/19 1151    Education Details  advised patient to continue performing LE stretches to avoid severe muscle soreness    Person(s) Educated  Patient    Methods  Explanation;Demonstration;Tactile cues;Verbal cues    Comprehension  Verbalized understanding       PT Short Term Goals - 06/26/19 1402      PT SHORT TERM GOAL #1   Title  Patient to be independent with initial HEP.    Time  3    Period  Weeks    Status  Achieved    Target Date  07/10/19        PT Long Term Goals - 06/21/19 1204      PT LONG TERM GOAL #1   Title  Patient to be independent with advanced HEP.    Time  7    Period  Weeks    Status  On-going      PT LONG TERM GOAL #2   Title  Patient to demonstrate B hip strength >/=4+/5.    Time  7    Period  Weeks    Status  On-going      PT LONG TERM GOAL #3   Title  Patient to demonstrate lumbar  AROM WFL and without pain limiting.    Time  7    Period  Weeks    Status  On-going      PT LONG TERM GOAL #4   Title  Patient to report 75% improvement in pain levels in AM.    Time  7    Period  Weeks    Status  On-going      PT LONG TERM GOAL #5   Title  Patient to report tolerance of 30 min of walking without pain limiting.    Time  7    Period  Weeks    Status  On-going            Plan - 07/03/19 1152    Clinical Impression Statement  Patient without new complaints this AM. Noting that he is able to walk with less pain on average than when he first began therapy. Worked on continuing standing LE strengthening ther-ex at [place] today. Patient performed hip abduction and extension with addition of light ankle weights. Demonstrated lateral and anterior trunk lean and reported pain in L buttock after completing these exercises, thus allowed patient a sitting rest break and had patient perform sitting hip stretches to relieve pain. Patient reported fatigue in B hips after sidestepping with banded resistance, which was again relieved with sitting hip stretching. Initiated hip hiking with patient requiring CGA and quickly fatiguing with this exercise. Ended session with STM and TPR to R proximal glutes, proximal lateral HS, and along ITB. Patient demonstrated several palpable and tender trigger points in these muscle groups. Patient declined modalities at end of session, and with no further complaints upon leaving.    Comorbidities  CKD, HTN, LBP, HLD, R hip OA, hx prostate and kidney CA in remission    PT Treatment/Interventions  ADLs/Self Care Home Management;Canalith Repostioning;Cryotherapy;Moist Heat;Traction;Balance training;Therapeutic exercise;Therapeutic activities;Functional mobility training;Stair training;Gait training;DME Instruction;Neuromuscular re-education;Patient/family education;Manual techniques;Taping;Energy conservation;Dry needling;Passive range of motion    PT  Next Visit Plan  progress LE stretching and hip strength; STS    Consulted and Agree with Plan of Care  Patient       Patient will benefit from skilled therapeutic intervention in order to improve the following deficits and impairments:  Abnormal gait,  Decreased activity tolerance, Decreased strength, Increased fascial restricitons, Pain, Increased muscle spasms, Difficulty walking, Decreased range of motion, Improper body mechanics, Postural dysfunction, Impaired flexibility, Decreased balance  Visit Diagnosis: Chronic bilateral low back pain with bilateral sciatica  Difficulty in walking, not elsewhere classified  Impaired flexibility of lower extremity     Problem List There are no problems to display for this patient.    Janene Harvey, PT, DPT 07/03/19 11:58 AM   [facility] [address]  [place], [place], [address] Phone: [phone]   Fax:  [phone]  Name: Craig Werner MRN: [id number] Date of Birth: 05/11/1934

## 2019-07-08 ENCOUNTER — Encounter: Payer: Self-pay | Admitting: Physical Therapy

## 2019-07-08 ENCOUNTER — Other Ambulatory Visit: Payer: Self-pay

## 2019-07-08 ENCOUNTER — Ambulatory Visit: Payer: No Typology Code available for payment source | Admitting: Physical Therapy

## 2019-07-08 VITALS — BP 135/62 | HR 73

## 2019-07-08 DIAGNOSIS — R29898 Other symptoms and signs involving the musculoskeletal system: Secondary | ICD-10-CM

## 2019-07-08 DIAGNOSIS — G8929 Other chronic pain: Secondary | ICD-10-CM

## 2019-07-08 DIAGNOSIS — R262 Difficulty in walking, not elsewhere classified: Secondary | ICD-10-CM

## 2019-07-08 DIAGNOSIS — M5442 Lumbago with sciatica, left side: Secondary | ICD-10-CM | POA: Diagnosis not present

## 2019-07-08 NOTE — Therapy (Signed)
[facility] [address]  [place], [place], [address] Phone: [phone]   Fax:  [phone]  Physical Therapy Treatment  Patient Details  Name: Craig Werner MRN: [id number] Date of Birth: 1934/03/06 Referring Provider (PT): [name], MD   Encounter Date: 07/08/2019  PT End of Session - 07/08/19 1051    Visit Number  7    Number of Visits  15    Date for PT Re-Evaluation  08/07/19    Authorization Type  [facility], VL: 15    Authorization - Visit Number  7    Authorization - Number of Visits  15    PT Start Time  1005    PT Stop Time  1048    PT Time Calculation (min)  43 min    Activity Tolerance  Patient tolerated treatment well;Patient limited by pain    Behavior During Therapy  [facility] for tasks assessed/performed       History reviewed. No pertinent past medical history.  History reviewed. No pertinent surgical history.  Vitals:   07/08/19 1006  BP: 135/62  Pulse: 73  SpO2: 93%    Subjective Assessment - 07/08/19 1008    Subjective  "I'm as sore as the dickens." Notes that he still has some residual HS soreness from HEP and appointments. Had his 1st COVID-19 vaccine on Saturday, Has appointment for injection for his back either this friday or saturday. Notes 25% improvement in LBP thus far.    Pertinent History  hx prostate and kidney CA in remission, hx MI,    Diagnostic tests  06/08/19 lumbar MRI: moderately severe lumbar spinal stenosis L4-5, small disc protrusion L5-S1,small synovial cyst at L2    Patient Stated Goals  "i want to be able to walk again"    Currently in Pain?  Yes    Pain Score  3     Pain Location  Leg    Pain Orientation  Left;Right;Posterior    Pain Descriptors / Indicators  Sore    Pain Type  Acute pain                       [facility] Adult PT Treatment/Exercise - 07/08/19 0001      Lumbar Exercises: Stretches   Passive Hamstring Stretch  Right;Left;30 seconds;2  reps    Passive Hamstring Stretch Limitations  supine with strap    Lower Trunk Rotation Limitations  x20 to tolerance    Hip Flexor Stretch  Right;Left;30 seconds;1 rep    Hip Flexor Stretch Limitations  mod thomas with strap    ITB Stretch  Right;Left;2 reps;30 seconds    ITB Stretch Limitations  supine with strap   manual cues to find proper stretch     Lumbar Exercises: Aerobic   Nustep  L4x 1min (LEs only)      Lumbar Exercises: Supine   Clam  10 reps    Clam Limitations  with red TB   good tolerance   Bridge with clamshell  10 reps   2x10; yellow TB around knees              PT Short Term Goals - 06/26/19 1402      PT SHORT TERM GOAL #1   Title  Patient to be independent with initial HEP.    Time  3    Period  Weeks    Status  Achieved    Target Date  07/10/19  PT Long Term Goals - 06/21/19 1204      PT LONG TERM GOAL #1   Title  Patient to be independent with advanced HEP.    Time  7    Period  Weeks    Status  On-going      PT LONG TERM GOAL #2   Title  Patient to demonstrate B hip strength >/=4+/5.    Time  7    Period  Weeks    Status  On-going      PT LONG TERM GOAL #3   Title  Patient to demonstrate lumbar AROM WFL and without pain limiting.    Time  7    Period  Weeks    Status  On-going      PT LONG TERM GOAL #4   Title  Patient to report 75% improvement in pain levels in AM.    Time  7    Period  Weeks    Status  On-going      PT LONG TERM GOAL #5   Title  Patient to report tolerance of 30 min of walking without pain limiting.    Time  7    Period  Weeks    Status  On-going            Plan - 07/08/19 1051    Clinical Impression Statement  Patient reported B HS soreness today after performing HEP and from previous session. Patient requesting to skip manual therapy today d/t soreness. Worked on gentle LE stretching for hopeful relief of patient's complaints. Continued with mat ther-[id number] for improvement in hip and core  strengthening. Patient reporting continued difficulty with bridges with hip abduction bias and demonstrating limited ROM with this exercise- likely partially d/t glute weakness and partially d/t hip flexor tightness. Tolerated increased banded resistance with supine clams without complaints, thus this was administered for HEP. Patient with difficulty sitting up from supine at end of session d/t noncompliance with log roll that patient was previously educated on. Reported R hip pain once sitting up. Declined modalities at end of session and no further complaints upon leaving.    Comorbidities  CKD, HTN, LBP, HLD, R hip OA, hx prostate and kidney CA in remission    PT Treatment/Interventions  ADLs/Self Care Home Management;Canalith Repostioning;Cryotherapy;Moist Heat;Traction;Balance training;Therapeutic exercise;Therapeutic activities;Functional mobility training;Stair training;Gait training;DME Instruction;Neuromuscular re-education;Patient/family education;Manual techniques;Taping;Energy conservation;Dry needling;Passive range of motion    PT Next Visit Plan  progress LE stretching and hip strength; STS    Consulted and Agree with Plan of Care  Patient       Patient will benefit from skilled therapeutic intervention in order to improve the following deficits and impairments:  Abnormal gait, Decreased activity tolerance, Decreased strength, Increased fascial restricitons, Pain, Increased muscle spasms, Difficulty walking, Decreased range of motion, Improper body mechanics, Postural dysfunction, Impaired flexibility, Decreased balance  Visit Diagnosis: Chronic bilateral low back pain with bilateral sciatica  Difficulty in walking, not elsewhere classified  Impaired flexibility of lower extremity     Problem List There are no problems to display for this patient.    Janene Harvey, PT, DPT 07/08/19 10:52 AM   [facility] [address]  [place], [place], [address] Phone: [phone]   Fax:  [phone]  Name: Craig Werner MRN: [id number] Date of Birth: 1934-04-15

## 2019-07-10 ENCOUNTER — Other Ambulatory Visit: Payer: Self-pay

## 2019-07-10 ENCOUNTER — Ambulatory Visit: Payer: No Typology Code available for payment source | Admitting: Physical Therapy

## 2019-07-10 ENCOUNTER — Encounter: Payer: Self-pay | Admitting: Physical Therapy

## 2019-07-10 VITALS — BP 140/63 | HR 91

## 2019-07-10 DIAGNOSIS — R29898 Other symptoms and signs involving the musculoskeletal system: Secondary | ICD-10-CM

## 2019-07-10 DIAGNOSIS — G8929 Other chronic pain: Secondary | ICD-10-CM

## 2019-07-10 DIAGNOSIS — R262 Difficulty in walking, not elsewhere classified: Secondary | ICD-10-CM

## 2019-07-10 DIAGNOSIS — M5442 Lumbago with sciatica, left side: Secondary | ICD-10-CM

## 2019-07-10 NOTE — Therapy (Signed)
[facility] [address]  [place], [place], [address] Phone: [phone]   Fax:  [phone]  Physical Therapy Treatment  Patient Details  Name: Craig Werner MRN: [id number] Date of Birth: June 30, 1933 Referring Provider (PT): [name], MD   Encounter Date: 07/10/2019  PT End of Session - 07/10/19 1058    Visit Number  8    Number of Visits  15    Date for PT Re-Evaluation  08/07/19    Authorization Type  [facility], VL: 9    Authorization - Visit Number  8    Authorization - Number of Visits  15    PT Start Time  [id number]    PT Stop Time  1105   moist heat   PT Time Calculation (min)  50 min    Activity Tolerance  Patient tolerated treatment well;Patient limited by pain    Behavior During Therapy  [facility] for tasks assessed/performed       History reviewed. No pertinent past medical history.  History reviewed. No pertinent surgical history.  Vitals:   07/10/19 1016  BP: 140/63  Pulse: 91  SpO2: 98%    Subjective Assessment - 07/10/19 1018    Subjective  Still feeling sore- about the same as last time. It is located in B HS and buttocks.    Pertinent History  hx prostate and kidney CA in remission, hx MI,    Diagnostic tests  06/08/19 lumbar MRI: moderately severe lumbar spinal stenosis L4-5, small disc protrusion L5-S1,small synovial cyst at L2    Patient Stated Goals  "i want to be able to walk again"    Currently in Pain?  Yes    Pain Score  3     Pain Location  Leg    Pain Orientation  Right;Left;Posterior    Pain Descriptors / Indicators  Sore    Pain Type  Acute pain                       [facility] Adult PT Treatment/Exercise - 07/10/19 0001      Lumbar Exercises: Stretches   Passive Hamstring Stretch  Right;Left;30 seconds;2 reps    Passive Hamstring Stretch Limitations  sitting with heel on step   cues to maintain neutral spine   Piriformis Stretch  Right;Left;30 seconds;1 rep    Piriformis Stretch Limitations  sitting KTOS    Figure 4 Stretch  30 seconds;With overpressure;Seated;1 rep    Figure 4 Stretch Limitations  sitting       Lumbar Exercises: Aerobic   Nustep  L4x 18min (LEs only)      Lumbar Exercises: Seated   Sit to Stand Limitations  sitting thoracic extension over chair with 1 and 2 airex pads x5 each   reporting good stretch   Other Seated Lumbar Exercises  sitting pelvic tilts x15   good mobility    Other Seated Lumbar Exercises  R/L pallof press with red TB x10 each side      Knee/Hip Exercises: Seated   Other Seated Knee/Hip Exercises  R/L resisted trunk rotation with red TB x10 each side   cues to avoid leaning back     Modalities   Modalities  Moist Heat      Moist Heat Therapy   Number Minutes Moist Heat  10 Minutes    Moist Heat Location  Hip   B HS  PT Education - 07/10/19 1058    Education Details  update to HEP    Person(s) Educated  Patient    Methods  Explanation;Demonstration;Tactile cues;Verbal cues;Handout    Comprehension  Verbalized understanding;Returned demonstration       PT Short Term Goals - 06/26/19 1402      PT SHORT TERM GOAL #1   Title  Patient to be independent with initial HEP.    Time  3    Period  Weeks    Status  Achieved    Target Date  07/10/19        PT Long Term Goals - 06/21/19 1204      PT LONG TERM GOAL #1   Title  Patient to be independent with advanced HEP.    Time  7    Period  Weeks    Status  On-going      PT LONG TERM GOAL #2   Title  Patient to demonstrate B hip strength >/=4+/5.    Time  7    Period  Weeks    Status  On-going      PT LONG TERM GOAL #3   Title  Patient to demonstrate lumbar AROM WFL and without pain limiting.    Time  7    Period  Weeks    Status  On-going      PT LONG TERM GOAL #4   Title  Patient to report 75% improvement in pain levels in AM.    Time  7    Period  Weeks    Status  On-going      PT LONG TERM GOAL #5   Title   Patient to report tolerance of 30 min of walking without pain limiting.    Time  7    Period  Weeks    Status  On-going            Plan - 07/10/19 1059    Clinical Impression Statement  Patient reporting continuation of B HS and buttocks soreness that was present at last appointment. Worked again on gentle LE stretching at beginning of appointment for hopeful improvement in patient's soreness. Patient reporting more benefit with sitting HS stretch than supine version, thus updated this exercise into HEP. Initiated pelvic tilts with patient demonstrating excellent mobility. Initiated core stabilization exercises with banded resistance in sitting with patient reporting no issues. Ended session with moist heat to B HS for soreness relief.    Comorbidities  CKD, HTN, LBP, HLD, R hip OA, hx prostate and kidney CA in remission    PT Treatment/Interventions  ADLs/Self Care Home Management;Canalith Repostioning;Cryotherapy;Moist Heat;Traction;Balance training;Therapeutic exercise;Therapeutic activities;Functional mobility training;Stair training;Gait training;DME Instruction;Neuromuscular re-education;Patient/family education;Manual techniques;Taping;Energy conservation;Dry needling;Passive range of motion    PT Next Visit Plan  progress LE stretching and hip strength; STS    Consulted and Agree with Plan of Care  Patient       Patient will benefit from skilled therapeutic intervention in order to improve the following deficits and impairments:  Abnormal gait, Decreased activity tolerance, Decreased strength, Increased fascial restricitons, Pain, Increased muscle spasms, Difficulty walking, Decreased range of motion, Improper body mechanics, Postural dysfunction, Impaired flexibility, Decreased balance  Visit Diagnosis: Chronic bilateral low back pain with bilateral sciatica  Difficulty in walking, not elsewhere classified  Impaired flexibility of lower extremity     Problem List There are  no problems to display for this patient.    Janene Harvey, PT, DPT 07/10/19 12:09 PM   [facility] [address]  [place], [place], [address] Phone: [phone]   Fax:  [phone]  Name: Craig Werner MRN: [id number] Date of Birth: 04-06-1934

## 2019-07-15 ENCOUNTER — Ambulatory Visit: Payer: No Typology Code available for payment source | Admitting: Physical Therapy

## 2019-07-15 ENCOUNTER — Other Ambulatory Visit: Payer: Self-pay

## 2019-07-15 ENCOUNTER — Encounter: Payer: Self-pay | Admitting: Physical Therapy

## 2019-07-15 VITALS — BP 129/60 | HR 88

## 2019-07-15 DIAGNOSIS — M5442 Lumbago with sciatica, left side: Secondary | ICD-10-CM | POA: Diagnosis not present

## 2019-07-15 DIAGNOSIS — R29898 Other symptoms and signs involving the musculoskeletal system: Secondary | ICD-10-CM

## 2019-07-15 DIAGNOSIS — R262 Difficulty in walking, not elsewhere classified: Secondary | ICD-10-CM

## 2019-07-15 DIAGNOSIS — G8929 Other chronic pain: Secondary | ICD-10-CM

## 2019-07-15 NOTE — Therapy (Signed)
[facility] [address]  [place], [place], [address] Phone: [phone]   Fax:  [phone]  Physical Therapy Treatment  Patient Details  Name: Craig Werner MRN: [id number] Date of Birth: [birth year] Referring Provider (PT): [name], MD   Encounter Date: 07/15/2019  PT End of Session - 07/15/19 1148    Visit Number  9    Number of Visits  15    Date for PT Re-Evaluation  08/07/19    Authorization Type  VA, VL: 88    Authorization - Visit Number  9    Authorization - Number of Visits  15    PT Start Time  1100    PT Stop Time  [id number]    PT Time Calculation (min)  42 min    Activity Tolerance  Patient tolerated treatment well;Patient limited by pain    Behavior During Therapy  [facility] for tasks assessed/performed       History reviewed. No pertinent past medical history.  History reviewed. No pertinent surgical history.  Vitals:   07/15/19 1102  BP: 129/60  Pulse: 88  SpO2: 96%    Subjective Assessment - 07/15/19 1104    Subjective  Had a spinal injection on Thursday and "it was hell." But his back is feeling a little better now. Feels like his B HS are still sore but improved- wondering if his mattress is contributing to this.    Pertinent History  hx prostate and kidney CA in remission, hx MI,    Diagnostic tests  06/08/19 lumbar MRI: moderately severe lumbar spinal stenosis L4-5, small disc protrusion L5-S1,small synovial cyst at L2    Patient Stated Goals  "i want to be able to walk again"    Currently in Pain?  No/denies                       [facility] Adult PT Treatment/Exercise - 07/15/19 0001      Lumbar Exercises: Stretches   Passive Hamstring Stretch  Right;Left;30 seconds;2 reps    Passive Hamstring Stretch Limitations  sitting with heel on step   cues to stay neutral in spine   Lower Trunk Rotation Limitations  x20 to tolerance    Piriformis Stretch  Right;Left;30 seconds;1  rep    Piriformis Stretch Limitations  supine KTOS    Figure 4 Stretch  30 seconds;With overpressure;2 reps;Supine    Figure 4 Stretch Limitations  supine       Lumbar Exercises: Aerobic   Nustep  L4x 9min (LEs only)      Lumbar Exercises: Supine   Clam  10 reps    Clam Limitations  1st set with red TB, 2nd set with green TB   cues to increase ROM on R     Knee/Hip Exercises: Stretches   ITB Stretch  Right;2 reps;30 seconds    ITB Stretch Limitations  supine with strap      Knee/Hip Exercises: Sidelying   Clams  on L side 2x10   manual cues to maintain hips still            PT Education - 07/15/19 1147    Education Details  advised to use green TB for supine clams    Person(s) Educated  Patient    Methods  Explanation;Demonstration;Tactile cues;Verbal cues    Comprehension  Verbalized understanding;Returned demonstration       PT Short Term Goals - 06/26/19 1402  PT SHORT TERM GOAL #1   Title  Patient to be independent with initial HEP.    Time  3    Period  Weeks    Status  Achieved    Target Date  07/10/19        PT Long Term Goals - 06/21/19 1204      PT LONG TERM GOAL #1   Title  Patient to be independent with advanced HEP.    Time  7    Period  Weeks    Status  On-going      PT LONG TERM GOAL #2   Title  Patient to demonstrate B hip strength >/=4+/5.    Time  7    Period  Weeks    Status  On-going      PT LONG TERM GOAL #3   Title  Patient to demonstrate lumbar AROM WFL and without pain limiting.    Time  7    Period  Weeks    Status  On-going      PT LONG TERM GOAL #4   Title  Patient to report 75% improvement in pain levels in AM.    Time  7    Period  Weeks    Status  On-going      PT LONG TERM GOAL #5   Title  Patient to report tolerance of 30 min of walking without pain limiting.    Time  7    Period  Weeks    Status  On-going            Plan - 07/15/19 1148    Clinical Impression Statement  Patient reporting that  he received a spinal injection on Thursday which was painful, but now noticing slight improvement in LBP. Still having B HS soreness but improved today. Patient performed gentle lumbopelvic and LE stretching to tolerance without complaints. Had difficulty finding a stretch with TFL stretch in supine, requiring manual positioning to find expected sensation. Demonstrated good form and muscle control with supine clams, with patient reporting improvement in ease since initiation of this exercise. Able to perform this with increased banded resistance today with more difficulty. Initiated clamshell in L sidelying with patient requiring cues to maintain hips still. Patient reported considerable difficulty; did not attempt on R d/t positional intolerance. Upon standing from exercise mat, patient with slight L LE buckling, reporting "it is my hip back here" and pointing to central LB. Advised patient to sit or stretch before leaving, but patient declined. Able to ambulate out of clinic without LOB.    Comorbidities  CKD, HTN, LBP, HLD, R hip OA, hx prostate and kidney CA in remission    PT Treatment/Interventions  ADLs/Self Care Home Management;Canalith Repostioning;Cryotherapy;Moist Heat;Traction;Balance training;Therapeutic exercise;Therapeutic activities;Functional mobility training;Stair training;Gait training;DME Instruction;Neuromuscular re-education;Patient/family education;Manual techniques;Taping;Energy conservation;Dry needling;Passive range of motion    PT Next Visit Plan  progress LE stretching and hip strength; STS    Consulted and Agree with Plan of Care  Patient       Patient will benefit from skilled therapeutic intervention in order to improve the following deficits and impairments:  Abnormal gait, Decreased activity tolerance, Decreased strength, Increased fascial restricitons, Pain, Increased muscle spasms, Difficulty walking, Decreased range of motion, Improper body mechanics, Postural  dysfunction, Impaired flexibility, Decreased balance  Visit Diagnosis: Chronic bilateral low back pain with bilateral sciatica  Difficulty in walking, not elsewhere classified  Impaired flexibility of lower extremity     Problem List There are no problems  to display for this patient.   Janene Harvey, PT, DPT 07/15/19 11:51 AM   [facility] [address]  [place], [place], [address] Phone: [phone]   Fax:  [phone]  Name: Craig Werner MRN: [id number] Date of Birth: August 09, 1933

## 2019-07-17 ENCOUNTER — Ambulatory Visit: Payer: No Typology Code available for payment source | Admitting: Physical Therapy

## 2019-07-17 ENCOUNTER — Other Ambulatory Visit: Payer: Self-pay

## 2019-07-17 ENCOUNTER — Encounter: Payer: Self-pay | Admitting: Physical Therapy

## 2019-07-17 VITALS — BP 132/70 | HR 74

## 2019-07-17 DIAGNOSIS — M5442 Lumbago with sciatica, left side: Secondary | ICD-10-CM

## 2019-07-17 DIAGNOSIS — G8929 Other chronic pain: Secondary | ICD-10-CM

## 2019-07-17 DIAGNOSIS — R29898 Other symptoms and signs involving the musculoskeletal system: Secondary | ICD-10-CM

## 2019-07-17 DIAGNOSIS — R262 Difficulty in walking, not elsewhere classified: Secondary | ICD-10-CM

## 2019-07-17 NOTE — Therapy (Signed)
[facility] [address]  [place], [place], [address] Phone: [phone]   Fax:  [phone]  Physical Therapy Progress Note  Patient Details  Name: Craig Werner MRN: [id number] Date of Birth: 01/30/1934 Referring Provider (PT): [name], MD   Progress Note Reporting Period 06/19/19 to 07/17/19  See note below for Objective Data and Assessment of Progress/Goals.    Encounter Date: 07/17/2019  PT End of Session - 07/17/19 1157    Visit Number  10    Number of Visits  15    Date for PT Re-Evaluation  08/07/19    Authorization Type  [facility], VL: 11    Authorization - Visit Number  10    Authorization - Number of Visits  15    PT Start Time  0488    PT Stop Time  1148    PT Time Calculation (min)  49 min    Activity Tolerance  Patient tolerated treatment well;Patient limited by pain    Behavior During Therapy  [facility] for tasks assessed/performed       History reviewed. No pertinent past medical history.  History reviewed. No pertinent surgical history.  Vitals:   07/17/19 1101  BP: 132/70  Pulse: 74  SpO2: 98%    Subjective Assessment - 07/17/19 1104    Subjective  Feeling pretty good today. No longer having muscle soreness but is having some back tightess without known cause. Reporting 30% improvement in LB since initial eval. Would like to continue addressing the tightness in his back.    Pertinent History  hx prostate and kidney CA in remission, hx MI,    Diagnostic tests  06/08/19 lumbar MRI: moderately severe lumbar spinal stenosis L4-5, small disc protrusion L5-S1,small synovial cyst at L2    Patient Stated Goals  "i want to be able to walk again"    Currently in Pain?  No/denies    Pain Score  3     Pain Location  Buttocks    Pain Orientation  Right;Left    Pain Descriptors / Indicators  Dull    Pain Type  Acute pain         [facility] PT Assessment - 07/17/19 0001      Observation/Other  Assessments   Focus on Therapeutic Outcomes (FOTO)   Lumbar: 43 (57% limited, 44% predicted)      AROM   Lumbar Flexion  mid shin   mild pain   Lumbar Extension  severely limited   severe pain   Lumbar - Right Side Bend   jt line   mild pain   Lumbar - Left Side Bend  jt line   mild pain   Lumbar - Right Rotation  mildly limited   mild pain   Lumbar - Left Rotation  mildly limited   mild pain     Strength   Right Hip Flexion  4+/5    Right Hip Extension  4/5    Right Hip External Rotation   4/5    Right Hip Internal Rotation  4/5    Right Hip ABduction  4/5    Right Hip ADduction  4+/5    Left Hip Flexion  4+/5    Left Hip Extension  4+/5    Left Hip External Rotation  4+/5    Left Hip Internal Rotation  4+/5    Left Hip ABduction  4+/5    Left Hip ADduction  4+/5  [facility] PT Treatment/Exercise - 07/17/19 0001      Lumbar Exercises: Aerobic   Nustep  L4x 33mn (LEs only)      Lumbar Exercises: Standing   Other Standing Lumbar Exercises  standing thoracic extension with elbows on wall x10   audible cavitation with pt reporting relief     Lumbar Exercises: Supine   Bridge with clamshell  10 reps   red TB above knees   Other Supine Lumbar Exercises  thoracic extension over foam roll x10 to tolerance   audible cavitation with pt reporting relief     Lumbar Exercises: Sidelying   Other Sidelying Lumbar Exercises  open book stretch 10x to tolerance on each side   cues to promote increased ROM            PT Education - 07/17/19 1149    Education Details  update to HEP; discussion on objective progress and remaining impairments    Person(s) Educated  Patient    Methods  Explanation;Demonstration;Tactile cues;Verbal cues;Handout    Comprehension  Verbalized understanding;Returned demonstration       PT Short Term Goals - 07/17/19 1109      PT SHORT TERM GOAL #1   Title  Patient to be independent with initial HEP.    Time  3     Period  Weeks    Status  Achieved    Target Date  07/10/19        PT Long Term Goals - 07/17/19 1109      PT LONG TERM GOAL #1   Title  Patient to be independent with advanced HEP.    Time  7    Period  Weeks    Status  Partially Met   met for current     PT LONG TERM GOAL #2   Title  Patient to demonstrate B hip strength >/=4+/5.    Time  7    Period  Weeks    Status  On-going   R hip abduciton strrength improved     PT LONG TERM GOAL #3   Title  Patient to demonstrate lumbar AROM WFL and without pain limiting.    Time  7    Period  Weeks    Status  On-going   pain levels increased; ROM more limited today d/t patient's c/o tightness     PT LONG TERM GOAL #4   Title  Patient to report 75% improvement in pain levels in AM.    Time  7    Period  Weeks    Status  Partially Met   30-40% improvement in AM pain levels     PT LONG TERM GOAL #5   Title  Patient to report tolerance of 30 min of walking without pain limiting.    Time  7    Period  Weeks    Status  On-going   reports 3 min before onset of pain           Plan - 07/17/19 1200    Clinical Impression Statement  Patient reporting resolution of B LE soreness but with increased tightness in LB today. Reporting 30% improvement in LBP thus far. Notes that morning pain has improved 30-40%. Strength testing revealed improvement in R hip abduction. Lumbar ROM more limited today d/t patient's c/o tightness but reported decreased pain levels. Patient still reports limited walking tolerance d/t LBP. Focused session on thoracolumbar mobility to address stiffness. Patient with several instances of audible cavitation in the spine  with ther-ex which brought him relief. Patient tolerated all exercises well and without complaints at end of session. Patient is demonstrating slow but steady progress towards goals. Would continue to benefit from skilled PT services to address remaining goals.    Comorbidities  CKD, HTN, LBP,  HLD, R hip OA, hx prostate and kidney CA in remission    PT Treatment/Interventions  ADLs/Self Care Home Management;Canalith Repostioning;Cryotherapy;Moist Heat;Traction;Balance training;Therapeutic exercise;Therapeutic activities;Functional mobility training;Stair training;Gait training;DME Instruction;Neuromuscular re-education;Patient/family education;Manual techniques;Taping;Energy conservation;Dry needling;Passive range of motion    PT Next Visit Plan  progress LE stretching and hip strength; STS    Consulted and Agree with Plan of Care  Patient       Patient will benefit from skilled therapeutic intervention in order to improve the following deficits and impairments:  Abnormal gait, Decreased activity tolerance, Decreased strength, Increased fascial restricitons, Pain, Increased muscle spasms, Difficulty walking, Decreased range of motion, Improper body mechanics, Postural dysfunction, Impaired flexibility, Decreased balance  Visit Diagnosis: Chronic bilateral low back pain with bilateral sciatica  Difficulty in walking, not elsewhere classified  Impaired flexibility of lower extremity     Problem List There are no problems to display for this patient.    Janene Harvey, PT, DPT 07/17/19 12:07 PM   [facility] [address]  [place], [place], [address] Phone: [phone]   Fax:  [phone]  Name: Craig Werner MRN: [id number] Date of Birth: 1933/07/02

## 2019-07-22 ENCOUNTER — Ambulatory Visit: Payer: No Typology Code available for payment source | Admitting: Physical Therapy

## 2019-07-22 ENCOUNTER — Other Ambulatory Visit: Payer: Self-pay

## 2019-07-22 ENCOUNTER — Encounter: Payer: Self-pay | Admitting: Physical Therapy

## 2019-07-22 VITALS — BP 140/70 | HR 75

## 2019-07-22 DIAGNOSIS — M5442 Lumbago with sciatica, left side: Secondary | ICD-10-CM | POA: Diagnosis not present

## 2019-07-22 DIAGNOSIS — R29898 Other symptoms and signs involving the musculoskeletal system: Secondary | ICD-10-CM

## 2019-07-22 DIAGNOSIS — R262 Difficulty in walking, not elsewhere classified: Secondary | ICD-10-CM

## 2019-07-22 DIAGNOSIS — G8929 Other chronic pain: Secondary | ICD-10-CM

## 2019-07-22 NOTE — Therapy (Signed)
[facility] [address]  [place], [place], [address] Phone: [phone]   Fax:  [phone]  Physical Therapy Treatment  Patient Details  Name: Craig Werner MRN: [id number] Date of Birth: [birth year] Referring Provider (PT): [name], MD   Encounter Date: 07/22/2019  PT End of Session - 07/22/19 1356    Visit Number  11    Number of Visits  15    Date for PT Re-Evaluation  08/07/19    Authorization Type  VA, VL: 15    Authorization - Visit Number  11    Authorization - Number of Visits  15    PT Start Time  7034    PT Stop Time  0352    PT Time Calculation (min)  41 min    Activity Tolerance  Patient tolerated treatment well;Other (comment)   limited by dizziness   Behavior During Therapy  [facility] for tasks assessed/performed       History reviewed. No pertinent past medical history.  History reviewed. No pertinent surgical history.  Vitals:   07/22/19 1313 07/22/19 1349  BP: 128/73 140/70  Pulse: 75 75  SpO2: 98% 98%    Subjective Assessment - 07/22/19 1317    Subjective  Feeling good. Seeing MD 07/29/19.    Pertinent History  hx prostate and kidney CA in remission, hx MI,    Diagnostic tests  06/08/19 lumbar MRI: moderately severe lumbar spinal stenosis L4-5, small disc protrusion L5-S1,small synovial cyst at L2    Patient Stated Goals  "i want to be able to walk again"    Currently in Pain?  No/denies                       [facility] Adult PT Treatment/Exercise - 07/22/19 0001      Lumbar Exercises: Stretches   Passive Hamstring Stretch  Right;Left;30 seconds;2 reps    Passive Hamstring Stretch Limitations  sitting with heel on step   cues to maintain neutral spine     Lumbar Exercises: Aerobic   Nustep  L5x 27mn (LEs only)      Lumbar Exercises: Seated   Other Seated Lumbar Exercises  sitting clam 2x10 with green TB       Lumbar Exercises: Supine   Other Supine Lumbar  Exercises  thoracic extension over foam roll x10 to tolerance   audible cavitation without pain     Knee/Hip Exercises: Standing   Hip Abduction  Stengthening;Right;Left;10 reps;Knee straight;2 sets    Abduction Limitations  at counter     Hip Extension  Stengthening;Right;Left;1 set;10 reps;Knee straight    Extension Limitations  at counter    good effort to maintain chest up tall   Forward Step Up  Left;Right;1 set;5 sets;Hand Hold: 1;Step Height: 6"    Forward Step Up Limitations  step up/back with each LE and 1 UE support on counter   with CGA; L LE intermittently buckling requiring min A   Wall Squat  2 sets;5 reps    Wall Squat Limitations  cues to decrease depth and avoid valgus positioning                PT Short Term Goals - 07/17/19 1109      PT SHORT TERM GOAL #1   Title  Patient to be independent with initial HEP.    Time  3    Period  Weeks    Status  Achieved  Target Date  07/10/19        PT Long Term Goals - 07/17/19 1109      PT LONG TERM GOAL #1   Title  Patient to be independent with advanced HEP.    Time  7    Period  Weeks    Status  Partially Met   met for current     PT LONG TERM GOAL #2   Title  Patient to demonstrate B hip strength >/=4+/5.    Time  7    Period  Weeks    Status  On-going   R hip abduciton strrength improved     PT LONG TERM GOAL #3   Title  Patient to demonstrate lumbar AROM WFL and without pain limiting.    Time  7    Period  Weeks    Status  On-going   pain levels increased; ROM more limited today d/t patient's c/o tightness     PT LONG TERM GOAL #4   Title  Patient to report 75% improvement in pain levels in AM.    Time  7    Period  Weeks    Status  Partially Met   30-40% improvement in AM pain levels     PT LONG TERM GOAL #5   Title  Patient to report tolerance of 30 min of walking without pain limiting.    Time  7    Period  Weeks    Status  On-going   reports 3 min before onset of pain            Plan - 07/22/19 1357    Clinical Impression Statement  Patient without new complaints today.  Worked on standing LE strengthening ther-ex for improvement in hip stability. Patient demonstrated good effort to maintain upright posture with standing hip extension and abduction. Intermittently performed sitting ther-ex to allow patient rest break from standing as this tends to increase his pain levels. Initiated step ups with patient demonstrating intermittent L LE buckling requiring min A to recover balance. Patient reporting that he has not been able to walk up to the 2nd story of his house for a couple months d/t his LBP and LE weakness. Upon completing thoracic extension exercise in supine, patient reported dizziness and feeling like "I have a cold sweat coming on." Offered water and allowed sitting rest break. Vitals WNL. Patient reported feeling better but requested to finish up appointment and was escorted out of clinic safely.    Comorbidities  CKD, HTN, LBP, HLD, R hip OA, hx prostate and kidney CA in remission    PT Treatment/Interventions  ADLs/Self Care Home Management;Canalith Repostioning;Cryotherapy;Moist Heat;Traction;Balance training;Therapeutic exercise;Therapeutic activities;Functional mobility training;Stair training;Gait training;DME Instruction;Neuromuscular re-education;Patient/family education;Manual techniques;Taping;Energy conservation;Dry needling;Passive range of motion    PT Next Visit Plan  progress LE stretching and hip strength; STS    Consulted and Agree with Plan of Care  Patient       Patient will benefit from skilled therapeutic intervention in order to improve the following deficits and impairments:  Abnormal gait, Decreased activity tolerance, Decreased strength, Increased fascial restricitons, Pain, Increased muscle spasms, Difficulty walking, Decreased range of motion, Improper body mechanics, Postural dysfunction, Impaired flexibility, Decreased  balance  Visit Diagnosis: Chronic bilateral low back pain with bilateral sciatica  Difficulty in walking, not elsewhere classified  Impaired flexibility of lower extremity     Problem List There are no problems to display for this patient.    Janene Harvey, PT, DPT 07/22/19  5:26 PM   [facility] [address]  [place], [place], [address] Phone: [phone]   Fax:  [phone]  Name: AYYAN SITES MRN: [id number] Date of Birth: 05/11/1934

## 2019-07-24 ENCOUNTER — Other Ambulatory Visit: Payer: Self-pay

## 2019-07-24 ENCOUNTER — Ambulatory Visit: Payer: No Typology Code available for payment source | Admitting: Physical Therapy

## 2019-07-24 ENCOUNTER — Encounter: Payer: Self-pay | Admitting: Physical Therapy

## 2019-07-24 VITALS — BP 137/63 | HR 76

## 2019-07-24 DIAGNOSIS — G8929 Other chronic pain: Secondary | ICD-10-CM

## 2019-07-24 DIAGNOSIS — M5442 Lumbago with sciatica, left side: Secondary | ICD-10-CM

## 2019-07-24 DIAGNOSIS — R262 Difficulty in walking, not elsewhere classified: Secondary | ICD-10-CM

## 2019-07-24 NOTE — Therapy (Signed)
[facility] [address]  [place], [place], [address] Phone: [phone]   Fax:  [phone]  Physical Therapy Treatment  Patient Details  Name: Craig Werner MRN: [id number] Date of Birth: [birth year] Referring Provider (PT): [name], MD   Encounter Date: 07/24/2019  PT End of Session - 07/24/19 1158    Visit Number  12    Number of Visits  15    Date for PT Re-Evaluation  08/07/19    Authorization Type  VA, VL: 6    Authorization - Visit Number  12    Authorization - Number of Visits  15    PT Start Time  1100    PT Stop Time  1856    PT Time Calculation (min)  48 min    Activity Tolerance  Patient tolerated treatment well;Patient limited by pain   limited by dizziness   Behavior During Therapy  [facility] for tasks assessed/performed       History reviewed. No pertinent past medical history.  History reviewed. No pertinent surgical history.  Vitals:   07/24/19 1102  BP: 137/63  Pulse: 76  SpO2: 96%    Subjective Assessment - 07/24/19 1104    Subjective  Not much is new. No longer having any dizziness that was present at last session. Has a little bit of HS soreness from last session.    Pertinent History  hx prostate and kidney CA in remission, hx MI,    Diagnostic tests  06/08/19 lumbar MRI: moderately severe lumbar spinal stenosis L4-5, small disc protrusion L5-S1,small synovial cyst at L2    Patient Stated Goals  "i want to be able to walk again"    Currently in Pain?  No/denies         [facility] PT Assessment - 07/24/19 0001      Assessment   Medical Diagnosis  Spinal Stenosis of Lumbar Region without Neurogenic Claudication    Referring Provider (PT)  [name], MD    Onset Date/Surgical Date  06/18/18      AROM   Lumbar Flexion  mid shin   mild pain   Lumbar Extension  mildly limited   mild pain   Lumbar - Right Side Bend  proximal shin   mild pain   Lumbar - Left Side Bend  jt  line   mild pain   Lumbar - Right Rotation  mildly limited   mild pain   Lumbar - Left Rotation  WNL   mild pain     Strength   Right Hip Flexion  4+/5    Right Hip Extension  4+/5    Right Hip External Rotation   4/5    Right Hip Internal Rotation  4/5    Right Hip ABduction  4/5    Right Hip ADduction  4+/5    Left Hip Flexion  4+/5    Left Hip Extension  4+/5    Left Hip External Rotation  4+/5    Left Hip Internal Rotation  4+/5    Left Hip ABduction  4+/5    Left Hip ADduction  4+/5                   [facility] Adult PT Treatment/Exercise - 07/24/19 0001      Lumbar Exercises: Stretches   Passive Hamstring Stretch  Right;Left;30 seconds;2 reps    Passive Hamstring Stretch Limitations  sitting with heel on step   cues to maintain  back straight   Figure 4 Stretch  30 seconds;With overpressure;Seated;1 rep    Figure 4 Stretch Limitations  cues to apply gentle overpressure      Lumbar Exercises: Aerobic   Nustep  L5x 58mn (LEs only)      Lumbar Exercises: Seated   Sit to Stand Limitations  sitting on green pball shoulder horizontal abduction x10 with yellow TB    Other Seated Lumbar Exercises  sitting pelvic tilts sitting on green pball x10   cues for end range ROM   Other Seated Lumbar Exercises  sitting on green pball alt marching x20; LAQ 5x each LE   intermittent min A for balance     Knee/Hip Exercises: Standing   Forward Step Up  Left;Right;1 set;5 sets;Hand Hold: 1;Step Height: 6"    Forward Step Up Limitations  step up/back and step up/over with 1 UE support on counter and SPC   sitting rest break required d/t muscle fatigue              PT Short Term Goals - 07/24/19 1138      PT SHORT TERM GOAL #1   Title  Patient to be independent with initial HEP.    Time  3    Period  Weeks    Status  Achieved    Target Date  07/10/19        PT Long Term Goals - 07/24/19 1139      PT LONG TERM GOAL #1   Title  Patient to be independent with  advanced HEP.    Time  7    Period  Weeks    Status  Partially Met   met for current     PT LONG TERM GOAL #2   Title  Patient to demonstrate B hip strength >/=4+/5.    Time  7    Period  Weeks    Status  On-going   improved R hip extensions trength     PT LONG TERM GOAL #3   Title  Patient to demonstrate lumbar AROM WFL and without pain limiting.    Time  7    Period  Weeks    Status  Partially Met   . Lumbar AROM has improved in extension, R sidebending, and L rotation     PT LONG TERM GOAL #4   Title  Patient to report 75% improvement in pain levels in AM.    Time  7    Period  Weeks    Status  Partially Met   40-50% improvement in AM pain levels     PT LONG TERM GOAL #5   Title  Patient to report tolerance of 30 min of walking without pain limiting.    Time  7    Period  Weeks    Status  On-going   able to tolerate 10 min           Plan - 07/24/19 1159    Clinical Impression Statement  Patient reporting mild HS soreness after last session, but denying dizziness. Notes that he has experienced 40-50% improvement in LBP in the AM since initial eval. Is now able to tolerate 10 min of walking before onset of pain. Lumbar AROM has improved in extension, R sidebending, and L rotation. Strength testing has revealed improvement in R hip extension strength. Patient performed sitting core stabilization ther-ex today while sitting on physioball. Did have difficulty maintaining balance, thus intermittent min A was provided. Worked on step ups with  counter support and SPC to simulate home environment. Patient required sitting break d/t muscle fatigue. No complaints at end of session. Patient is demonstrating good progress towards goals. Would benefit form continued skilled PT services to address remaining goals.    Comorbidities  CKD, HTN, LBP, HLD, R hip OA, hx prostate and kidney CA in remission    PT Treatment/Interventions  ADLs/Self Care Home Management;Canalith  Repostioning;Cryotherapy;Moist Heat;Traction;Balance training;Therapeutic exercise;Therapeutic activities;Functional mobility training;Stair training;Gait training;DME Instruction;Neuromuscular re-education;Patient/family education;Manual techniques;Taping;Energy conservation;Dry needling;Passive range of motion    PT Next Visit Plan  progress LE stretching and hip strength; STS    Consulted and Agree with Plan of Care  Patient       Patient will benefit from skilled therapeutic intervention in order to improve the following deficits and impairments:  Abnormal gait, Decreased activity tolerance, Decreased strength, Increased fascial restricitons, Pain, Increased muscle spasms, Difficulty walking, Decreased range of motion, Improper body mechanics, Postural dysfunction, Impaired flexibility, Decreased balance  Visit Diagnosis: Chronic bilateral low back pain with bilateral sciatica  Difficulty in walking, not elsewhere classified     Problem List There are no problems to display for this patient.     Janene Harvey, PT, DPT 07/24/19 12:04 PM   [facility] [address]  [place], [place], [address] Phone: [phone]   Fax:  [phone]  Name: BRENNON OTTERNESS MRN: [id number] Date of Birth: Mar 15, 1934

## 2019-07-29 ENCOUNTER — Encounter: Payer: Non-veteran care | Admitting: Physical Therapy

## 2019-07-30 ENCOUNTER — Ambulatory Visit: Payer: No Typology Code available for payment source | Admitting: Physical Therapy

## 2019-07-31 ENCOUNTER — Encounter: Payer: Non-veteran care | Admitting: Physical Therapy

## 2019-08-01 ENCOUNTER — Other Ambulatory Visit: Payer: Self-pay | Admitting: Neurosurgery

## 2019-08-02 ENCOUNTER — Other Ambulatory Visit: Payer: Self-pay

## 2019-08-02 ENCOUNTER — Encounter: Payer: Self-pay | Admitting: Physical Therapy

## 2019-08-02 ENCOUNTER — Ambulatory Visit: Payer: No Typology Code available for payment source | Attending: Neurosurgery | Admitting: Physical Therapy

## 2019-08-02 ENCOUNTER — Other Ambulatory Visit: Payer: Self-pay | Admitting: Neurosurgery

## 2019-08-02 VITALS — BP 155/70 | HR 64

## 2019-08-02 DIAGNOSIS — R262 Difficulty in walking, not elsewhere classified: Secondary | ICD-10-CM | POA: Insufficient documentation

## 2019-08-02 DIAGNOSIS — M5442 Lumbago with sciatica, left side: Secondary | ICD-10-CM | POA: Insufficient documentation

## 2019-08-02 DIAGNOSIS — M5441 Lumbago with sciatica, right side: Secondary | ICD-10-CM | POA: Diagnosis present

## 2019-08-02 DIAGNOSIS — G8929 Other chronic pain: Secondary | ICD-10-CM | POA: Insufficient documentation

## 2019-08-02 NOTE — Therapy (Signed)
[facility] [address]  [place], [place], [address] Phone: [phone]   Fax:  [phone]  Physical Therapy Discharge Summary  Patient Details  Name: Craig Werner MRN: [id number] Date of Birth: 16-Aug-1933 Referring Provider (PT): [name], MD   Progress Note Reporting Period 07/22/19 to 08/02/19  See note below for Objective Data and Assessment of Progress/Goals.     Encounter Date: 08/02/2019  PT End of Session - 08/02/19 1142    Visit Number  13    Number of Visits  15    Date for PT Re-Evaluation  08/07/19    Authorization Type  [facility], VL: 74    Authorization - Visit Number  13    Authorization - Number of Visits  15    PT Start Time  1101    PT Stop Time  1139    PT Time Calculation (min)  38 min    Activity Tolerance  Patient tolerated treatment well   limited by dizziness   Behavior During Therapy  [facility] for tasks assessed/performed       History reviewed. No pertinent past medical history.  History reviewed. No pertinent surgical history.  Vitals:   08/02/19 1102  BP: (!) 155/70  Pulse: 64  SpO2: 95%    Subjective Assessment - 08/02/19 1106    Subjective  Went to see his MD who scheduled him for back surgery on 08/15/19. Would like to wrap up with PT today.    Pertinent History  hx prostate and kidney CA in remission, hx MI,    Diagnostic tests  06/08/19 lumbar MRI: moderately severe lumbar spinal stenosis L4-5, small disc protrusion L5-S1,small synovial cyst at L2    Patient Stated Goals  "i want to be able to walk again"    Currently in Pain?  Yes    Pain Score  2     Pain Location  Hip    Pain Orientation  Right;Left;Lateral    Pain Descriptors / Indicators  Dull    Pain Type  Acute pain         [facility] PT Assessment - 08/02/19 0001      AROM   Lumbar Flexion  ankles   moderate R buttock pain   Lumbar Extension  moderately limited    Lumbar - Right Side Bend  jt line    mild pain   Lumbar - Left Side Bend  jt line   mild pain   Lumbar - Right Rotation  mildly limited    Lumbar - Left Rotation  WNL      Strength   Right Hip Flexion  4+/5    Right Hip Extension  4+/5    Right Hip External Rotation   4/5    Right Hip Internal Rotation  4+/5    Right Hip ABduction  4/5    Right Hip ADduction  4+/5    Left Hip Flexion  4+/5    Left Hip Extension  4+/5    Left Hip External Rotation  4+/5    Left Hip Internal Rotation  4+/5    Left Hip ABduction  4+/5    Left Hip ADduction  4+/5                   [facility] Adult PT Treatment/Exercise - 08/02/19 0001      Lumbar Exercises: Aerobic   Nustep  L5x 66mn (LEs only)  PT Education - 08/02/19 1141    Education Details  update and consolidation of HEP; discussion on objective progress and remaining impairments    Person(s) Educated  Patient    Methods  Explanation;Demonstration;Tactile cues;Verbal cues;Handout    Comprehension  Verbalized understanding;Returned demonstration       PT Short Term Goals - 08/02/19 1118      PT SHORT TERM GOAL #1   Title  Patient to be independent with initial HEP.    Time  3    Period  Weeks    Status  Achieved    Target Date  07/10/19        PT Long Term Goals - 08/02/19 1118      PT LONG TERM GOAL #1   Title  Patient to be independent with advanced HEP.    Time  7    Period  Weeks    Status  Achieved      PT LONG TERM GOAL #2   Title  Patient to demonstrate B hip strength >/=4+/5.    Time  7    Period  Weeks    Status  Partially Met   improved R hip IR     PT LONG TERM GOAL #3   Title  Patient to demonstrate lumbar AROM WFL and without pain limiting.    Time  7    Period  Weeks    Status  Partially Met   improved flexion and R SBing     PT LONG TERM GOAL #4   Title  Patient to report 75% improvement in pain levels in AM.    Time  7    Period  Weeks    Status  Partially Met   60% improvement in AM pain levels      PT LONG TERM GOAL #5   Title  Patient to report tolerance of 30 min of walking without pain limiting.    Time  7    Period  Weeks    Status  Not Met   3-4 minutes           Plan - 08/02/19 1142    Clinical Impression Statement  Patient reporting being scheduled for L4-5 Laminectomy on 08/15/19. Requesting to wrap up with therapy today. Patient reports 60% improvement in AM pain levels. However, still noting 3-4 minutes of walking before onset of pain. Strength testing revealed improvement in R hip IR. Lumbar AROM has improved in flexion and R sidebending. Reviewed and consolidated HEP with patient for max benefit- patient reported understanding. Patient notes good relief of tightness in his back since therapy and is independent with HEP. Discharging patient at this time per his request.    Comorbidities  CKD, HTN, LBP, HLD, R hip OA, hx prostate and kidney CA in remission    PT Treatment/Interventions  ADLs/Self Care Home Management;Canalith Repostioning;Cryotherapy;Moist Heat;Traction;Balance training;Therapeutic exercise;Therapeutic activities;Functional mobility training;Stair training;Gait training;DME Instruction;Neuromuscular re-education;Patient/family education;Manual techniques;Taping;Energy conservation;Dry needling;Passive range of motion    PT Next Visit Plan  DC at this time    Consulted and Agree with Plan of Care  Patient       Patient will benefit from skilled therapeutic intervention in order to improve the following deficits and impairments:  Abnormal gait, Decreased activity tolerance, Decreased strength, Increased fascial restricitons, Pain, Increased muscle spasms, Difficulty walking, Decreased range of motion, Improper body mechanics, Postural dysfunction, Impaired flexibility, Decreased balance  Visit Diagnosis: Chronic bilateral low back pain with bilateral sciatica  Difficulty in walking, not  elsewhere classified     Problem List There are no problems to  display for this patient.    PHYSICAL THERAPY DISCHARGE SUMMARY  Visits from Start of Care: 13  Current functional level related to goals / functional outcomes: See above clinical impression    Remaining deficits: Decreased walking tolerance, decreased hip strength and lumbar AROM   Education / Equipment: HEP  Plan: Patient agrees to discharge.  Patient goals were partially met. Patient is being discharged due to the patient's request.  ?????     Janene Harvey, PT, DPT 08/02/19 11:50 AM    [facility] [address]  [place], [place], [address] Phone: [phone]   Fax:  [phone]  Name: Craig Werner MRN: [id number] Date of Birth: 1934/01/19

## 2019-08-05 ENCOUNTER — Ambulatory Visit: Payer: No Typology Code available for payment source | Admitting: Physical Therapy

## 2019-08-07 ENCOUNTER — Encounter: Payer: Non-veteran care | Admitting: Physical Therapy

## 2019-08-07 ENCOUNTER — Ambulatory Visit: Payer: No Typology Code available for payment source | Admitting: Physical Therapy

## 2019-08-13 NOTE — Progress Notes (Signed)
[id number] - [place], [place] - [address], [address] AT [facility] [address] [place], [address] [place] [address] Phone: [phone] Fax: [phone]      Your procedure is scheduled on Thursday, March 18th.  Report to [facility] Main Entrance "A" at 5:30 A.M., and check in at the Admitting office.  Call this number if you have problems the morning of surgery:  [phone]  Call [phone] if you have any questions prior to your surgery date Monday-Friday 8am-4pm    Remember:  Do not eat or drink after midnight the night before your surgery     Take these medicines the morning of surgery with A SIP OF WATER   Tylenol - if needed  Albuterol inhaler - if needed  Amlodipine (Norvasc)  Atorvastatin (Lipitor)  Metoprolol (Lopressor)    7 days prior to surgery STOP taking any Aspirin (unless otherwise instructed by your surgeon), Aleve, Naproxen, Ibuprofen, Motrin, Advil, Goody's, BC's, all herbal medications, fish oil, and all vitamins.    The Morning of Surgery  Do not wear jewelry.  Do not wear lotions, powders, colognes, or deodorant  Men may shave face and neck.  Do not bring valuables to the hospital.  [facility] is not responsible for any belongings or valuables.  If you are a smoker, DO NOT Smoke 24 hours prior to surgery  If you wear a CPAP at night please bring your mask the morning of surgery   Remember that you must have someone to transport you home after your surgery, and remain with you for 24 hours if you are discharged the same day.   Please bring cases for contacts, glasses, hearing aids, dentures or bridgework because it cannot be worn into surgery.    Leave your suitcase in the car.  After surgery it may be brought to your room.  For patients admitted to the hospital, discharge time will be determined by your treatment team.  Patients discharged the day of surgery will not be allowed to drive home.     Special instructions:   [facility]- Preparing For Surgery  Before surgery, you can play an important role. Because skin is not sterile, your skin needs to be as free of germs as possible. You can reduce the number of germs on your skin by washing with CHG (chlorahexidine gluconate) Soap before surgery.  CHG is an antiseptic cleaner which kills germs and bonds with the skin to continue killing germs even after washing.    Oral Hygiene is also important to reduce your risk of infection.  Remember - BRUSH YOUR TEETH THE MORNING OF SURGERY WITH YOUR REGULAR TOOTHPASTE  Please do not use if you have an allergy to CHG or antibacterial soaps. If your skin becomes reddened/irritated stop using the CHG.  Do not shave (including legs and underarms) for at least 48 hours prior to first CHG shower. It is OK to shave your face.  Please follow these instructions carefully.   1. Shower the NIGHT BEFORE SURGERY and the MORNING OF SURGERY with CHG Soap.   2. If you chose to wash your hair, wash your hair first as usual with your normal shampoo.  3. After you shampoo, rinse your hair and body thoroughly to remove the shampoo.  4. Use CHG as you would any other liquid soap. You can apply CHG directly to the skin and wash gently with a scrungie or a clean washcloth.   5. Apply the CHG  Soap to your body ONLY FROM THE NECK DOWN.  Do not use on open wounds or open sores. Avoid contact with your eyes, ears, mouth and genitals (private parts). Wash Face and genitals (private parts)  with your normal soap.   6. Wash thoroughly, paying special attention to the area where your surgery will be performed.  7. Thoroughly rinse your body with warm water from the neck down.  8. DO NOT shower/wash with your normal soap after using and rinsing off the CHG Soap.  9. Pat yourself dry with a CLEAN TOWEL.  10. Wear CLEAN PAJAMAS to bed the night before surgery, wear comfortable clothes the morning of  surgery  11. Place CLEAN SHEETS on your bed the night of your first shower and DO NOT SLEEP WITH PETS.    Day of Surgery:  Please shower the morning of surgery with the CHG soap Do not apply any deodorants/lotions. Please wear clean clothes to the hospital/surgery center.   Remember to brush your teeth WITH YOUR REGULAR TOOTHPASTE.   Please read over the following fact sheets that you were given.

## 2019-08-14 ENCOUNTER — Other Ambulatory Visit (HOSPITAL_COMMUNITY)
Admission: RE | Admit: 2019-08-14 | Discharge: 2019-08-14 | Disposition: A | Discharge status: Home / Self Care | Payer: No Typology Code available for payment source | Source: Ambulatory Visit | Attending: Neurosurgery | Admitting: Neurosurgery

## 2019-08-14 ENCOUNTER — Encounter (HOSPITAL_COMMUNITY)
Admission: RE | Admit: 2019-08-14 | Discharge: 2019-08-14 | Disposition: A | Discharge status: Home / Self Care | Payer: No Typology Code available for payment source | Source: Ambulatory Visit | Attending: Neurosurgery | Admitting: Neurosurgery

## 2019-08-14 ENCOUNTER — Other Ambulatory Visit: Payer: Self-pay

## 2019-08-14 ENCOUNTER — Encounter (HOSPITAL_COMMUNITY): Payer: Self-pay

## 2019-08-14 DIAGNOSIS — R9431 Abnormal electrocardiogram [ECG] [EKG]: Secondary | ICD-10-CM | POA: Diagnosis not present

## 2019-08-14 DIAGNOSIS — Z01810 Encounter for preprocedural cardiovascular examination: Secondary | ICD-10-CM | POA: Insufficient documentation

## 2019-08-14 DIAGNOSIS — Z01812 Encounter for preprocedural laboratory examination: Secondary | ICD-10-CM | POA: Diagnosis present

## 2019-08-14 DIAGNOSIS — Z20822 Contact with and (suspected) exposure to covid-19: Secondary | ICD-10-CM | POA: Insufficient documentation

## 2019-08-14 HISTORY — DX: Essential (primary) hypertension: I10

## 2019-08-14 HISTORY — DX: Acute myocardial infarction, unspecified: I21.9

## 2019-08-14 HISTORY — DX: Chronic kidney disease, unspecified: N18.9

## 2019-08-14 HISTORY — DX: Malignant (primary) neoplasm, unspecified: C80.1

## 2019-08-14 HISTORY — DX: Unspecified osteoarthritis, unspecified site: M19.90

## 2019-08-14 HISTORY — DX: Pneumonia, unspecified organism: J18.9

## 2019-08-14 LAB — BASIC METABOLIC PANEL
Anion gap: 12 (ref 5–15)
BUN: 21 mg/dL (ref 8–23)
CO2: 22 mmol/L (ref 22–32)
Calcium: 9.6 mg/dL (ref 8.9–10.3)
Chloride: 104 mmol/L (ref 98–111)
Creatinine, Ser: 1.62 mg/dL — ABNORMAL HIGH (ref 0.61–1.24)
GFR calc Af Amer: 45 mL/min — ABNORMAL LOW (ref >60)
GFR calc non Af Amer: 39 mL/min — ABNORMAL LOW (ref >60)
Glucose, Bld: 138 mg/dL — ABNORMAL HIGH (ref 70–99)
Potassium: 3.8 mmol/L (ref 3.5–5.1)
Sodium: 138 mmol/L (ref 135–145)

## 2019-08-14 LAB — CBC
HCT: 44.6 % (ref 39.0–52.0)
Hemoglobin: 14.1 g/dL (ref 13.0–17.0)
MCH: 29.6 pg (ref 26.0–34.0)
MCHC: 31.6 g/dL (ref 30.0–36.0)
MCV: 93.5 fL (ref 80.0–100.0)
Platelets: 266 10*3/uL (ref 150–400)
RBC: 4.77 MIL/uL (ref 4.22–5.81)
RDW: 14.4 % (ref 11.5–15.5)
WBC: 8 10*3/uL (ref 4.0–10.5)
nRBC: 0 % (ref 0.0–0.2)

## 2019-08-14 LAB — SARS CORONAVIRUS 2 (TAT 6-24 HRS): SARS Coronavirus 2: NEGATIVE

## 2019-08-14 LAB — SURGICAL PCR SCREEN
MRSA, PCR: NEGATIVE
Staphylococcus aureus: NEGATIVE

## 2019-08-14 NOTE — Progress Notes (Addendum)
Patient's paper work brought in by [employer], Environmental manager, who stated it was left in waiting area.  Called and spoke with patient's wife notifying her that patient left some paperwork in an envelope in the waiting area. Per patinet's wife's request, paperwork may be thrown away as it was only instructions on how to get to covid testing site and what to do today for Pre-admission testing appointment. Contents in envelope confirmed to be as such; contents disposed in shredder box.

## 2019-08-14 NOTE — Anesthesia Preprocedure Evaluation (Addendum)
Anesthesia Evaluation  Patient identified by MRN, date of birth, ID band Patient awake    Reviewed: Allergy & Precautions, NPO status , Patient's Chart, lab work & pertinent test results, reviewed documented beta blocker date and time   History of Anesthesia Complications Negative for: history of anesthetic complications  Airway Mallampati: II  TM Distance: >3 FB Neck ROM: Full    Dental  (+) Poor Dentition, Chipped, Missing, Dental Advisory Given   Pulmonary COPD,  COPD inhaler, former smoker,  08/14/2019 SARS coronavirus NEG   breath sounds clear to auscultation       Cardiovascular hypertension, Pt. on medications and Pt. on home beta blockers (-) angina+ Cardiac Stents (RCA)   Rhythm:Regular Rate:Normal  '17 ECHO: EF 65%, normal valves   Neuro/Psych Back pain with neurogenic claudication    GI/Hepatic negative GI ROS, Neg liver ROS,   Endo/Other  obese  Renal/GU Renal InsufficiencyRenal disease (creat 1.62)H/o kidney cancer   H/o prostate cancer    Musculoskeletal  (+) Arthritis ,   Abdominal (+) + obese,   Peds  Hematology negative hematology ROS (+)   Anesthesia Other Findings   Reproductive/Obstetrics                          Anesthesia Physical Anesthesia Plan  ASA: III  Anesthesia Plan: General   Post-op Pain Management:    Induction: Intravenous  PONV Risk Score and Plan: 2 and Ondansetron and Dexamethasone  Airway Management Planned: Oral ETT  Additional Equipment: None  Intra-op Plan:   Post-operative Plan: Extubation in OR  Informed Consent: I have reviewed the patients History and Physical, chart, labs and discussed the procedure including the risks, benefits and alternatives for the proposed anesthesia with the patient or authorized representative who has indicated his/her understanding and acceptance.     Dental advisory given  Plan Discussed with: CRNA  and Surgeon  Anesthesia Plan Comments: (Follow with cardiology at [facility] for CAD s/p stents to RCA in 2003 and 2005, HTN, HLD. He was last seen 10/31/18 and was doing well at that time. Per note, "TTE in 2017 showed mild concentric LVH with an EF of around 65% with structurally normal valves. He remains stable at this time with no chest pain or other anginal symptoms. I will make no changes to his medications and he will continue to take aspirin, amlodipine, atorvastatin, losartan, and metoprolol."  History of renal cell carcinoma s/p L nephrectomy and CKDIII. Currently being followed by nephrology. Creatine is stable 1.4-1.6.   Pt has clearance from PCP and cardiology both stating pt moderate risk for surgery. Cleared to stop ASA 5d preop. Copies on chart.  Preop labs reviewed, creatinine 1.62 which is near his baseline. Labs otherwise unremarkable.   EKG 08/14/19: Normal sinus rhythm. Rate 88. Inferior infarct , age undetermined)      Anesthesia Quick Evaluation

## 2019-08-14 NOTE — Progress Notes (Signed)
Anesthesia Chart Review:  Follow with cardiology at [facility] for CAD s/p stents to RCA in 2003 and 2005, HTN, HLD. He was last seen 10/31/18 and was doing well at that time. Per note, "TTE in 2017 showed mild concentric LVH with an EF of around 65% with structurally normal valves. He remains stable at this time with no chest pain or other anginal symptoms. I will make no changes to his medications and he will continue to take aspirin, amlodipine, atorvastatin, losartan, and metoprolol."  History of renal cell carcinoma s/p L nephrectomy and CKDIII. Currently being followed by nephrology. Creatine is stable 1.4-1.6.   Pt has clearance from PCP and cardiology both stating pt moderate risk for surgery. Cleared to stop ASA 5d preop. Copies on chart.  Preop labs reviewed, creatinine 1.62 which is near his baseline. Labs otherwise unremarkable.   EKG 08/14/19: Normal sinus rhythm. Rate 88. Inferior infarct , age undetermined   Wynonia Musty [facility] Short Stay Center/Anesthesiology Phone [phone] 08/14/2019 3:09 PM

## 2019-08-14 NOTE — Progress Notes (Signed)
PCP - Domenick Bookbinder, MD Cardiologist - Patient cannot remember. Per patient, he last saw a cardiologist 10 years or more ago when he had to have stents placed at [place] - Denies  Chest x-ray - N/A EKG - 08/14/19 Stress Test - Patient unsure; patient thinks he may have had one ~ 10 years ago at [facility] when he had stents placed. Documents requested. ECHO - Patient unsure; patient thinks he may have had one ~ 10 years ago at [facility] when he had stents placed. Documents requested. Cardiac Cath - Patient thinks he had one done, with stent placement ~ 10 years ago at [place] requested.  Sleep Study - Denies  Patient denies being a diabetic.  Blood Thinner Instructions: N/A Aspirin Instructions: Per patient, he was given instructions to stop ASA 7 days prior to sx. Last dose was 08/07/19.  ERAS Protcol - N/A PRE-SURGERY Ensure or G2- N/A  COVID TEST- 08/14/19   Anesthesia review: Yes, cardiac hx. Documents requested.   Patient denies shortness of breath, fever, cough and chest pain at PAT appointment   All instructions explained to the patient, with a verbal understanding of the material. Patient agrees to go over the instructions while at home for a better understanding. Patient also instructed to self quarantine after being tested for COVID-19. The opportunity to ask questions was provided.

## 2019-08-15 ENCOUNTER — Encounter (HOSPITAL_COMMUNITY)
Admission: RE | Disposition: A | Discharge status: Home / Self Care | Payer: Self-pay | Source: Home / Self Care | Attending: Neurosurgery

## 2019-08-15 ENCOUNTER — Ambulatory Visit (HOSPITAL_COMMUNITY): Payer: No Typology Code available for payment source | Admitting: Physician Assistant

## 2019-08-15 ENCOUNTER — Ambulatory Visit (HOSPITAL_COMMUNITY)
Admission: RE | Admit: 2019-08-15 | Discharge: 2019-08-15 | Disposition: A | Discharge status: Home / Self Care | Payer: No Typology Code available for payment source | Attending: Neurosurgery | Admitting: Neurosurgery

## 2019-08-15 ENCOUNTER — Encounter (HOSPITAL_COMMUNITY): Payer: Self-pay

## 2019-08-15 ENCOUNTER — Ambulatory Visit (HOSPITAL_COMMUNITY): Payer: No Typology Code available for payment source

## 2019-08-15 ENCOUNTER — Ambulatory Visit (HOSPITAL_COMMUNITY): Payer: No Typology Code available for payment source | Admitting: Anesthesiology

## 2019-08-15 ENCOUNTER — Other Ambulatory Visit: Payer: Self-pay

## 2019-08-15 DIAGNOSIS — Z79899 Other long term (current) drug therapy: Secondary | ICD-10-CM | POA: Insufficient documentation

## 2019-08-15 DIAGNOSIS — Z87891 Personal history of nicotine dependence: Secondary | ICD-10-CM | POA: Diagnosis not present

## 2019-08-15 DIAGNOSIS — Z9660 Presence of unspecified orthopedic joint implant: Secondary | ICD-10-CM | POA: Diagnosis not present

## 2019-08-15 DIAGNOSIS — Z6830 Body mass index (BMI) 30.0-30.9, adult: Secondary | ICD-10-CM | POA: Insufficient documentation

## 2019-08-15 DIAGNOSIS — Z885 Allergy status to narcotic agent status: Secondary | ICD-10-CM | POA: Insufficient documentation

## 2019-08-15 DIAGNOSIS — Z955 Presence of coronary angioplasty implant and graft: Secondary | ICD-10-CM | POA: Insufficient documentation

## 2019-08-15 DIAGNOSIS — Z905 Acquired absence of kidney: Secondary | ICD-10-CM | POA: Diagnosis not present

## 2019-08-15 DIAGNOSIS — E669 Obesity, unspecified: Secondary | ICD-10-CM | POA: Insufficient documentation

## 2019-08-15 DIAGNOSIS — Z7982 Long term (current) use of aspirin: Secondary | ICD-10-CM | POA: Insufficient documentation

## 2019-08-15 DIAGNOSIS — M1990 Unspecified osteoarthritis, unspecified site: Secondary | ICD-10-CM | POA: Diagnosis not present

## 2019-08-15 DIAGNOSIS — Z8546 Personal history of malignant neoplasm of prostate: Secondary | ICD-10-CM | POA: Insufficient documentation

## 2019-08-15 DIAGNOSIS — R519 Headache, unspecified: Secondary | ICD-10-CM | POA: Diagnosis not present

## 2019-08-15 DIAGNOSIS — Z888 Allergy status to other drugs, medicaments and biological substances status: Secondary | ICD-10-CM | POA: Insufficient documentation

## 2019-08-15 DIAGNOSIS — N189 Chronic kidney disease, unspecified: Secondary | ICD-10-CM | POA: Insufficient documentation

## 2019-08-15 DIAGNOSIS — I252 Old myocardial infarction: Secondary | ICD-10-CM | POA: Diagnosis not present

## 2019-08-15 DIAGNOSIS — M48062 Spinal stenosis, lumbar region with neurogenic claudication: Secondary | ICD-10-CM | POA: Diagnosis not present

## 2019-08-15 DIAGNOSIS — I129 Hypertensive chronic kidney disease with stage 1 through stage 4 chronic kidney disease, or unspecified chronic kidney disease: Secondary | ICD-10-CM | POA: Diagnosis not present

## 2019-08-15 DIAGNOSIS — Z85528 Personal history of other malignant neoplasm of kidney: Secondary | ICD-10-CM | POA: Diagnosis not present

## 2019-08-15 DIAGNOSIS — Z419 Encounter for procedure for purposes other than remedying health state, unspecified: Secondary | ICD-10-CM

## 2019-08-15 DIAGNOSIS — M48061 Spinal stenosis, lumbar region without neurogenic claudication: Secondary | ICD-10-CM | POA: Diagnosis present

## 2019-08-15 DIAGNOSIS — J449 Chronic obstructive pulmonary disease, unspecified: Secondary | ICD-10-CM | POA: Insufficient documentation

## 2019-08-15 HISTORY — PX: LUMBAR LAMINECTOMY/DECOMPRESSION MICRODISCECTOMY: SHX5026

## 2019-08-15 SURGERY — LUMBAR LAMINECTOMY/DECOMPRESSION MICRODISCECTOMY 1 LEVEL
Anesthesia: General | Site: Spine Lumbar

## 2019-08-15 MED ORDER — LIDOCAINE 2% (20 MG/ML) 5 ML SYRINGE
INTRAMUSCULAR | Status: AC
  Filled 2019-08-15: qty 5

## 2019-08-15 MED ORDER — ACETAMINOPHEN 325 MG PO TABS: 650.0000 mg | ORAL_TABLET | ORAL | Status: DC | PRN

## 2019-08-15 MED ORDER — CEFAZOLIN SODIUM-DEXTROSE 2-4 GM/100ML-% IV SOLN: 2.0000 g | Freq: Three times a day (TID) | INTRAVENOUS | Status: DC

## 2019-08-15 MED ORDER — SODIUM CHLORIDE 0.9 % IV SOLN: 250.0000 mL | INTRAVENOUS | Status: DC

## 2019-08-15 MED ORDER — DEXAMETHASONE SODIUM PHOSPHATE 10 MG/ML IJ SOLN
INTRAMUSCULAR | Status: DC | PRN
  Administered 2019-08-15: 6 mg via INTRAVENOUS

## 2019-08-15 MED ORDER — ROCURONIUM BROMIDE 10 MG/ML (PF) SYRINGE
PREFILLED_SYRINGE | INTRAVENOUS | Status: AC
  Filled 2019-08-15: qty 10

## 2019-08-15 MED ORDER — DOCUSATE SODIUM 100 MG PO CAPS
100.0000 mg | ORAL_CAPSULE | Freq: Two times a day (BID) | ORAL | Status: DC
  Filled 2019-08-15 (×2): qty 1

## 2019-08-15 MED ORDER — DEXAMETHASONE SODIUM PHOSPHATE 10 MG/ML IJ SOLN
INTRAMUSCULAR | Status: AC
  Filled 2019-08-15: qty 1

## 2019-08-15 MED ORDER — OXYCODONE HCL 5 MG PO TABS: 5.0000 mg | ORAL_TABLET | ORAL | Status: DC | PRN

## 2019-08-15 MED ORDER — LIDOCAINE-EPINEPHRINE 1 %-1:100000 IJ SOLN
INTRAMUSCULAR | Status: AC
  Filled 2019-08-15: qty 1

## 2019-08-15 MED ORDER — ONDANSETRON HCL 4 MG/2ML IJ SOLN
INTRAMUSCULAR | Status: DC | PRN
  Administered 2019-08-15: 4 mg via INTRAVENOUS

## 2019-08-15 MED ORDER — ACETAMINOPHEN 500 MG PO TABS
1000.0000 mg | ORAL_TABLET | Freq: Once | ORAL | Status: AC
  Administered 2019-08-15: 1000 mg via ORAL
  Filled 2019-08-15: qty 2

## 2019-08-15 MED ORDER — MENTHOL 3 MG MT LOZG: 1.0000 | LOZENGE | OROMUCOSAL | Status: DC | PRN

## 2019-08-15 MED ORDER — 0.9 % SODIUM CHLORIDE (POUR BTL) OPTIME
TOPICAL | Status: DC | PRN
  Administered 2019-08-15: 1000 mL

## 2019-08-15 MED ORDER — ONDANSETRON HCL 4 MG PO TABS
4.0000 mg | ORAL_TABLET | Freq: Four times a day (QID) | ORAL | Status: DC | PRN
  Filled 2019-08-15: qty 1

## 2019-08-15 MED ORDER — FENTANYL CITRATE (PF) 100 MCG/2ML IJ SOLN
INTRAMUSCULAR | Status: DC | PRN
  Administered 2019-08-15: 250 ug via INTRAVENOUS

## 2019-08-15 MED ORDER — MIDAZOLAM HCL 2 MG/2ML IJ SOLN: 0.5000 mg | Freq: Once | INTRAMUSCULAR | Status: DC | PRN

## 2019-08-15 MED ORDER — BUPIVACAINE HCL (PF) 0.5 % IJ SOLN
INTRAMUSCULAR | Status: AC
  Filled 2019-08-15: qty 30

## 2019-08-15 MED ORDER — MEPERIDINE HCL 25 MG/ML IJ SOLN: 6.2500 mg | INTRAMUSCULAR | Status: DC | PRN

## 2019-08-15 MED ORDER — SODIUM CHLORIDE 0.9% FLUSH: 3.0000 mL | Freq: Two times a day (BID) | INTRAVENOUS | Status: DC

## 2019-08-15 MED ORDER — PHENOL 1.4 % MT LIQD
1.0000 | OROMUCOSAL | Status: DC | PRN
  Filled 2019-08-15: qty 177

## 2019-08-15 MED ORDER — PROPOFOL 10 MG/ML IV BOLUS
INTRAVENOUS | Status: DC | PRN
  Administered 2019-08-15: 100 mg via INTRAVENOUS

## 2019-08-15 MED ORDER — SODIUM CHLORIDE 0.9 % IV SOLN
INTRAVENOUS | Status: DC | PRN
  Administered 2019-08-15: 500 mL

## 2019-08-15 MED ORDER — FENTANYL CITRATE (PF) 250 MCG/5ML IJ SOLN
INTRAMUSCULAR | Status: AC
  Filled 2019-08-15: qty 5

## 2019-08-15 MED ORDER — BUPIVACAINE LIPOSOME 1.3 % IJ SUSP
20.0000 mL | Freq: Once | INTRAMUSCULAR | Status: DC
  Filled 2019-08-15: qty 20

## 2019-08-15 MED ORDER — THROMBIN 5000 UNITS EX SOLR
OROMUCOSAL | Status: DC | PRN
  Administered 2019-08-15: 5 mL via TOPICAL

## 2019-08-15 MED ORDER — OXYCODONE HCL 5 MG PO TABS: 10.0000 mg | ORAL_TABLET | ORAL | Status: DC | PRN

## 2019-08-15 MED ORDER — SODIUM CHLORIDE 0.9% FLUSH: 3.0000 mL | INTRAVENOUS | Status: DC | PRN

## 2019-08-15 MED ORDER — PHENYLEPHRINE HCL-NACL 10-0.9 MG/250ML-% IV SOLN
INTRAVENOUS | Status: DC | PRN
  Administered 2019-08-15: 25 ug/min via INTRAVENOUS

## 2019-08-15 MED ORDER — ACETAMINOPHEN 650 MG RE SUPP: 650.0000 mg | RECTAL | Status: DC | PRN

## 2019-08-15 MED ORDER — HYDROMORPHONE HCL 1 MG/ML IJ SOLN: 0.5000 mg | INTRAMUSCULAR | Status: DC | PRN

## 2019-08-15 MED ORDER — ONDANSETRON HCL 4 MG/2ML IJ SOLN
INTRAMUSCULAR | Status: AC
  Filled 2019-08-15: qty 2

## 2019-08-15 MED ORDER — HYDROMORPHONE HCL 1 MG/ML IJ SOLN
0.2500 mg | INTRAMUSCULAR | Status: DC | PRN
  Administered 2019-08-15: 10:00:00 0.25 mg via INTRAVENOUS

## 2019-08-15 MED ORDER — LACTATED RINGERS IV SOLN: INTRAVENOUS | Status: DC | PRN

## 2019-08-15 MED ORDER — POLYETHYLENE GLYCOL 3350 17 G PO PACK
17.0000 g | PACK | Freq: Every day | ORAL | Status: DC | PRN
  Filled 2019-08-15: qty 1

## 2019-08-15 MED ORDER — BUPIVACAINE LIPOSOME 1.3 % IJ SUSP
INTRAMUSCULAR | Status: DC | PRN
  Administered 2019-08-15: 20 mL

## 2019-08-15 MED ORDER — SUCCINYLCHOLINE CHLORIDE 200 MG/10ML IV SOSY
PREFILLED_SYRINGE | INTRAVENOUS | Status: AC
  Filled 2019-08-15: qty 10

## 2019-08-15 MED ORDER — ROCURONIUM BROMIDE 50 MG/5ML IV SOSY
PREFILLED_SYRINGE | INTRAVENOUS | Status: DC | PRN
  Administered 2019-08-15: 20 mg via INTRAVENOUS
  Administered 2019-08-15: 60 mg via INTRAVENOUS

## 2019-08-15 MED ORDER — CEFAZOLIN SODIUM-DEXTROSE 2-3 GM-%(50ML) IV SOLR
INTRAVENOUS | Status: DC | PRN
  Administered 2019-08-15: 2 g via INTRAVENOUS

## 2019-08-15 MED ORDER — LIDOCAINE HCL (CARDIAC) PF 100 MG/5ML IV SOSY
PREFILLED_SYRINGE | INTRAVENOUS | Status: DC | PRN
  Administered 2019-08-15: 40 mg via INTRAVENOUS

## 2019-08-15 MED ORDER — PROPOFOL 10 MG/ML IV BOLUS
INTRAVENOUS | Status: AC
  Filled 2019-08-15: qty 20

## 2019-08-15 MED ORDER — CYCLOBENZAPRINE HCL 10 MG PO TABS: 10.0000 mg | ORAL_TABLET | Freq: Three times a day (TID) | ORAL | Status: DC | PRN

## 2019-08-15 MED ORDER — HYDROMORPHONE HCL 1 MG/ML IJ SOLN
INTRAMUSCULAR | Status: AC
  Filled 2019-08-15: qty 1

## 2019-08-15 MED ORDER — SODIUM CHLORIDE 0.9 % IV SOLN: INTRAVENOUS | Status: DC

## 2019-08-15 MED ORDER — PHENYLEPHRINE 40 MCG/ML (10ML) SYRINGE FOR IV PUSH (FOR BLOOD PRESSURE SUPPORT)
PREFILLED_SYRINGE | INTRAVENOUS | Status: AC
  Filled 2019-08-15: qty 10

## 2019-08-15 MED ORDER — METHYLPREDNISOLONE ACETATE 80 MG/ML IJ SUSP
INTRAMUSCULAR | Status: AC
  Filled 2019-08-15: qty 1

## 2019-08-15 MED ORDER — BUPIVACAINE HCL (PF) 0.5 % IJ SOLN
INTRAMUSCULAR | Status: DC | PRN
  Administered 2019-08-15: 25 mL

## 2019-08-15 MED ORDER — THROMBIN 5000 UNITS EX SOLR
CUTANEOUS | Status: AC
  Filled 2019-08-15: qty 15000

## 2019-08-15 MED ORDER — ONDANSETRON HCL 4 MG/2ML IJ SOLN: 4.0000 mg | Freq: Four times a day (QID) | INTRAMUSCULAR | Status: DC | PRN

## 2019-08-15 MED ORDER — LIDOCAINE-EPINEPHRINE 1 %-1:100000 IJ SOLN
INTRAMUSCULAR | Status: DC | PRN
  Administered 2019-08-15: 9 mL

## 2019-08-15 SURGICAL SUPPLY — 63 items
BAG DECANTER FOR FLEXI CONT (MISCELLANEOUS) ×3 IMPLANT
BAND RUBBER #18 3X1/16 STRL (MISCELLANEOUS) ×6 IMPLANT
BENZOIN TINCTURE PRP APPL 2/3 (GAUZE/BANDAGES/DRESSINGS) ×3 IMPLANT
BLADE CLIPPER SURG (BLADE) IMPLANT
BLADE SURG 11 STRL SS (BLADE) ×3 IMPLANT
BUR MATCHSTICK NEURO 3.0 LAGG (BURR) ×3 IMPLANT
BUR PRECISION FLUTE 5.0 (BURR) ×3 IMPLANT
CANISTER SUCT 3000ML PPV (MISCELLANEOUS) ×3 IMPLANT
CARTRIDGE OIL MAESTRO DRILL (MISCELLANEOUS) ×1 IMPLANT
CLOSURE STERI-STRIP 1/2X4 (GAUZE/BANDAGES/DRESSINGS) ×1
CLOSURE WOUND 1/2 X4 (GAUZE/BANDAGES/DRESSINGS) ×1
CLSR STERI-STRIP ANTIMIC 1/2X4 (GAUZE/BANDAGES/DRESSINGS) ×2 IMPLANT
COVER WAND RF STERILE (DRAPES) ×3 IMPLANT
DECANTER SPIKE VIAL GLASS SM (MISCELLANEOUS) ×3 IMPLANT
DIFFUSER DRILL AIR PNEUMATIC (MISCELLANEOUS) ×3 IMPLANT
DRAPE LAPAROTOMY 100X72X124 (DRAPES) ×3 IMPLANT
DRAPE MICROSCOPE LEICA (MISCELLANEOUS) ×3 IMPLANT
DRAPE SURG 17X23 STRL (DRAPES) ×3 IMPLANT
DRSG MEPILEX BORDER 4X4 (GAUZE/BANDAGES/DRESSINGS) IMPLANT
DRSG OPSITE POSTOP 3X4 (GAUZE/BANDAGES/DRESSINGS) IMPLANT
DRSG OPSITE POSTOP 4X6 (GAUZE/BANDAGES/DRESSINGS) ×3 IMPLANT
DURAPREP 26ML APPLICATOR (WOUND CARE) ×3 IMPLANT
ELECT COATED BLADE 2.86 ST (ELECTRODE) ×3 IMPLANT
ELECT REM PT RETURN 9FT ADLT (ELECTROSURGICAL) ×3
ELECTRODE REM PT RTRN 9FT ADLT (ELECTROSURGICAL) ×1 IMPLANT
GAUZE 4X4 16PLY RFD (DISPOSABLE) IMPLANT
GLOVE BIOGEL PI IND STRL 7.0 (GLOVE) ×2 IMPLANT
GLOVE BIOGEL PI IND STRL 7.5 (GLOVE) ×2 IMPLANT
GLOVE BIOGEL PI INDICATOR 7.0 (GLOVE) ×4
GLOVE BIOGEL PI INDICATOR 7.5 (GLOVE) ×4
GLOVE ECLIPSE 7.5 STRL STRAW (GLOVE) IMPLANT
GLOVE EXAM NITRILE XL STR (GLOVE) IMPLANT
GLOVE SURG SS PI 7.0 STRL IVOR (GLOVE) ×9 IMPLANT
GOWN STRL REUS W/ TWL LRG LVL3 (GOWN DISPOSABLE) ×2 IMPLANT
GOWN STRL REUS W/ TWL XL LVL3 (GOWN DISPOSABLE) ×1 IMPLANT
GOWN STRL REUS W/TWL 2XL LVL3 (GOWN DISPOSABLE) IMPLANT
GOWN STRL REUS W/TWL LRG LVL3 (GOWN DISPOSABLE) ×4
GOWN STRL REUS W/TWL XL LVL3 (GOWN DISPOSABLE) ×2
HEMOSTAT POWDER KIT SURGIFOAM (HEMOSTASIS) ×3 IMPLANT
KIT BASIN OR (CUSTOM PROCEDURE TRAY) ×3 IMPLANT
KIT TURNOVER KIT B (KITS) ×3 IMPLANT
NEEDLE HYPO 18GX1.5 BLUNT FILL (NEEDLE) IMPLANT
NEEDLE HYPO 21X1.5 SAFETY (NEEDLE) ×3 IMPLANT
NEEDLE HYPO 22GX1.5 SAFETY (NEEDLE) ×3 IMPLANT
NEEDLE SPNL 18GX3.5 QUINCKE PK (NEEDLE) ×3 IMPLANT
NS IRRIG 1000ML POUR BTL (IV SOLUTION) ×3 IMPLANT
OIL CARTRIDGE MAESTRO DRILL (MISCELLANEOUS) ×3
PACK LAMINECTOMY NEURO (CUSTOM PROCEDURE TRAY) ×3 IMPLANT
PAD ARMBOARD 7.5X6 YLW CONV (MISCELLANEOUS) ×9 IMPLANT
SPONGE LAP 4X18 RFD (DISPOSABLE) IMPLANT
SPONGE SURGIFOAM ABS GEL SZ50 (HEMOSTASIS) IMPLANT
STRIP CLOSURE SKIN 1/2X4 (GAUZE/BANDAGES/DRESSINGS) ×2 IMPLANT
SUT MNCRL AB 4-0 PS2 18 (SUTURE) ×3 IMPLANT
SUT MON AB 3-0 SH 27 (SUTURE) ×2
SUT MON AB 3-0 SH27 (SUTURE) ×1 IMPLANT
SUT VIC AB 0 CT1 18XCR BRD8 (SUTURE) ×1 IMPLANT
SUT VIC AB 0 CT1 8-18 (SUTURE) ×2
SUT VIC AB 2-0 CP2 18 (SUTURE) ×3 IMPLANT
SUT VIC AB 2-0 CT1 18 (SUTURE) ×3 IMPLANT
SYR 3ML LL SCALE MARK (SYRINGE) IMPLANT
TOWEL GREEN STERILE (TOWEL DISPOSABLE) ×3 IMPLANT
TOWEL GREEN STERILE FF (TOWEL DISPOSABLE) ×3 IMPLANT
WATER STERILE IRR 1000ML POUR (IV SOLUTION) ×3 IMPLANT

## 2019-08-15 NOTE — H&P (Signed)
Chief Complaint   No chief complaint on file.   History of Present Illness  Craig Werner is a 86 y.o. male  Who was evaluated in clinic lumbar stenosis.  He reports physical therapy did help somewhat but his pain remains of 5 or 6/10 usually.  It is relieved with sitting down and leaning forward.  He has difficulty with ambulating due to the pain in his posterior buttocks and thighs and he is no longer able to do the things he enjoys doing.  He attempted injections but had no significant improvement.  There have been no changes in the interval since his last clinic visit.   Past Medical History   Past Medical History:  Diagnosis Date  . Arthritis   . Cancer [facility])    prostate cancer; kidney cancer  . Chronic kidney disease    kidney cancer; Left kidney removed  . Headache    cluster headaches  . Hypertension   . Myocardial infarction ([place])    x 2   . Pneumonia     Past Surgical History   Past Surgical History:  Procedure Laterality Date  . APPENDECTOMY    . CARDIAC CATHETERIZATION     x 3 stents  . EYE SURGERY Right    right eye sx.  Marland Kitchen JOINT REPLACEMENT Right    3 joint replacements  . TONSILLECTOMY      Social History   Social History   Tobacco Use  . Smoking status: Former Research scientist (life sciences)  . Smokeless tobacco: Never Used  . Tobacco comment: quit 40 years ago  Substance Use Topics  . Alcohol use: Yes    Comment: 2-3 beers every other night  . Drug use: Never    Medications   Prior to Admission medications   Medication Sig Start Date End Date Taking? Authorizing Provider  acetaminophen (TYLENOL) 500 MG tablet Take 1,000 mg by mouth every 8 (eight) hours as needed.   Yes [provider]  albuterol (VENTOLIN HFA) 108 (90 Base) MCG/ACT inhaler Inhale 1-2 puffs into the lungs every 6 (six) hours as needed for wheezing or shortness of breath.   Yes [provider]  amLODipine (NORVASC) 5 MG tablet Take 5 mg by mouth daily.   Yes [provider]  aspirin EC 81 MG tablet Take 81 mg by mouth daily.   Yes [provider]  atorvastatin (LIPITOR) 40 MG tablet Take 40 mg by mouth daily.   Yes [provider]  Cholecalciferol (VITAMIN D3 PO) Take 1 tablet by mouth daily.   Yes [provider]  losartan (COZAAR) 100 MG tablet Take 100 mg by mouth daily.   Yes [provider]  metoprolol tartrate (LOPRESSOR) 25 MG tablet Take 25 mg by mouth daily. 06/29/19  Yes [provider]  Multiple Vitamin (MULTIVITAMIN WITH MINERALS) TABS tablet Take 1 tablet by mouth daily. Multivitamin for Men 67+   Yes [provider]  sildenafil (VIAGRA) 100 MG tablet Take 100 mg by mouth daily as needed. 05/31/19  Yes [provider]  Travoprost, BAK Free, (TRAVATAN) 0.004 % SOLN ophthalmic solution Place 1 drop into both eyes at bedtime.   Yes [provider]    Allergies   Allergies  Allergen Reactions  . Lisinopril Cough  . Promethazine Other (See Comments)    Hallucinations    . Morphine Itching         Review of Systems  ROS  Neurologic Exam  Awake, alert, oriented Memory and concentration  grossly intact Speech fluent, appropriate CN grossly intact Motor exam: Upper Extremities Deltoid Bicep Tricep Grip  Right 5/5 5/5 5/5 5/5  Left 5/5 5/5 5/5 5/5   Lower Extremities IP Quad PF DF EHL  Right 5/5 5/5 5/5 5/5 5/5  Left 5/5 5/5 5/5 5/5 5/5   Sensation grossly intact to LT  Imaging  MRI L-spine shows severe lumbar stenosis at L4-5  Impression  - 86 y.o. male  With severe lumbar stenosis and neurogenic claudication  Plan  I had a long discussion with the patient and his wife regarding treatment options.  As he has persistent significant symptoms affecting his quality of life, and given the severity of the stenosis,  I did offer a possibility of L4-5 posterior lumbar decompression.  Medical clearance was obtained.  Risks, benefits, alternatives, expected convalescence  were discussed.  Risks discussed included, but were not limited to, bleeding, pain, infection, spinal fluid leak, recurrent stenosis, instability, need for further surgery, and death.  He wished to proceed with surgery and informed consent was obtained.

## 2019-08-15 NOTE — Anesthesia Procedure Notes (Signed)
Procedure Name: Intubation Date/Time: 08/15/2019 8:03 AM Performed by: Lavell Luster, CRNA Pre-anesthesia Checklist: Patient identified, Emergency Drugs available, Patient being monitored, Suction available and Timeout performed Patient Re-evaluated:Patient Re-evaluated prior to induction Oxygen Delivery Method: Circle system utilized Preoxygenation: Pre-oxygenation with 100% oxygen Induction Type: IV induction Ventilation: Mask ventilation without difficulty Laryngoscope Size: Mac and 4 Grade View: Grade I Tube type: Oral Tube size: 7.5 mm Number of attempts: 1 Airway Equipment and Method: Stylet Placement Confirmation: ETT inserted through vocal cords under direct vision,  positive ETCO2 and breath sounds checked- equal and bilateral Secured at: 21 cm Tube secured with: Tape Dental Injury: Teeth and Oropharynx as per pre-operative assessment

## 2019-08-15 NOTE — Transfer of Care (Signed)
Immediate Anesthesia Transfer of Care Note  Patient: Craig Werner  Procedure(s) Performed: Lumbar four-five Laminectomy/Foraminotomy (N/A Spine Lumbar)  Patient Location: PACU  Anesthesia Type:General  Level of Consciousness: awake, alert  and oriented  Airway & Oxygen Therapy: Patient connected to face mask oxygen  Post-op Assessment: Post -op Vital signs reviewed and stable  Post vital signs: stable  Last Vitals:  Vitals Value Taken Time  BP 129/62 08/15/19 1008  Temp    Pulse 75 08/15/19 1009  Resp 16 08/15/19 1009  SpO2 99 % 08/15/19 1009  Vitals shown include unvalidated device data.  Last Pain:  Vitals:   08/15/19 [id number]  TempSrc:   PainSc: 0-No pain         Complications: No apparent anesthesia complications

## 2019-08-15 NOTE — Op Note (Signed)
PREOP DIAGNOSIS: Lumbar spinal stenosis, L4-5  POSTOP DIAGNOSIS: Lumbar spinal stenosis, L4-5  PROCEDURE: 1. L4-5 laminectomy, bilateral foraminotomies and medial facetectomies  SURGEON: Dr. [name], MD  ASSISTANT: Dr. Consuella Lose.  Please note, there were no qualified trainees available to assist with the procedure.  An assistant was required for aid in retraction of the neural elements.   ANESTHESIA: General Endotracheal  EBL: 50 ml  SPECIMENS: None  DRAINS: None  COMPLICATIONS: none  CONDITION: Stable to PCAU  HISTORY: Craig Werner is a 86 y.o. male who initially presented to the outpatient clinic with symptoms consistent with neurogenic claudication. MRI showed L4-5 stenosis. Treatment options were discussed and the patient elected to proceed with surgical decompression by laminectomy and foraminotomies at L4-5.  PROCEDURE IN DETAIL: After informed consent was obtained and witnessed, the patient was brought to the operating room. After induction of general anesthesia, the patient was positioned on the operative table in the prone position with all pressure points meticulously padded. The skin of the low back was then prepped and draped in the usual sterile fashion.  Under fluoroscopy, the correct levels were identified and marked out on the skin, and after timeout was conducted, the skin was infiltrated with local anesthetic. Skin incision was then made sharply and Bovie electrocautery was used to dissect the subcutaneous tissue until the lumbodorsal fascia was identified. The fascia was then incised using Bovie electrocautery and the lamina at the L4-5 levels was identified and dissection was carried out in the subperiosteal plane. Self-retaining retractor was then placed, and intraoperative x-ray was taken to confirm we were at the correct levels.  Using a high-speed drill, laminectomies were completed removing the inferior portion of L4 and the very superior  portion of L5. The ligamentum flavum was then identified and removed and the lateral edge of the thecal sac was identified bilaterally. Using a combination of the high-speed drill and Rogers, good lateral decompression was completed. Kerrison rongeurs were then used to complete foraminotomies at L4-5 bilaterally.  The decompression was confirmed with easy passage of  a small ball tip dissector.  Hemostasis was then secured using a combination of morcellized Gelfoam and thrombin and bipolar electrocautery. The wound was irrigated with copious amounts of antibiotic saline irrigation.  Exparel was injected into the paraspinous muscles and soft tissues.  Self-retaining retractor was then removed, and the wound is closed in layers using a combination of interrupted 0 Vicryl and 2-0 Vicryl stitches followed by 4-0 monocryl in subcuticular manner and mastisol and steri-strips.  A sterile dressing was placed.   At the end of the case all sponge, needle, and instrument counts were correct. The patient was then transferred to the stretcher and taken to the postanesthesia care unit in stable hemodynamic condition.

## 2019-08-15 NOTE — Progress Notes (Signed)
This RN ambulated pt around PACU, from [place] [facility] to nurses' station to RR. Pt tolerated walking well and stated he felt great ambulating. ~30ft.

## 2019-08-15 NOTE — Anesthesia Postprocedure Evaluation (Signed)
Anesthesia Post Note  Patient: Craig Werner  Procedure(s) Performed: Lumbar four-five Laminectomy/Foraminotomy (N/A Spine Lumbar)     Patient location during evaluation: PACU Anesthesia Type: General Level of consciousness: awake and alert, patient cooperative and oriented Pain management: pain level controlled Vital Signs Assessment: post-procedure vital signs reviewed and stable Respiratory status: spontaneous breathing, nonlabored ventilation, respiratory function stable and patient connected to nasal cannula oxygen Cardiovascular status: blood pressure returned to baseline and stable Postop Assessment: no apparent nausea or vomiting Anesthetic complications: no    Last Vitals:  Vitals:   08/15/19 1022 08/15/19 1037  BP: 125/66 139/75  Pulse: 72 67  Resp: 14 12  Temp:  36.6 C  SpO2: 96% 92%    Last Pain:  Vitals:   08/15/19 1037  TempSrc:   PainSc: 5                  Artha Chiasson,E. Patrick Salemi

## 2019-09-11 ENCOUNTER — Encounter: Payer: Self-pay | Admitting: Physical Therapy

## 2019-09-11 ENCOUNTER — Other Ambulatory Visit: Payer: Self-pay

## 2019-09-11 ENCOUNTER — Ambulatory Visit: Payer: No Typology Code available for payment source | Attending: Neurosurgery | Admitting: Physical Therapy

## 2019-09-11 DIAGNOSIS — M6281 Muscle weakness (generalized): Secondary | ICD-10-CM | POA: Diagnosis present

## 2019-09-11 DIAGNOSIS — R2689 Other abnormalities of gait and mobility: Secondary | ICD-10-CM | POA: Insufficient documentation

## 2019-09-11 DIAGNOSIS — R262 Difficulty in walking, not elsewhere classified: Secondary | ICD-10-CM | POA: Diagnosis present

## 2019-09-11 DIAGNOSIS — M545 Low back pain, unspecified: Secondary | ICD-10-CM

## 2019-09-11 NOTE — Therapy (Signed)
[facility] [address]  [place], [place], [address] Phone: [phone]   Fax:  [phone]  Physical Therapy Evaluation  Patient Details  Name: Craig Werner MRN: [id number] Date of Birth: 01/25/1934 Referring Provider (PT): [name], MD   Encounter Date: 09/11/2019  PT End of Session - 09/11/19 1220    Visit Number  1    Number of Visits  13    Date for PT Re-Evaluation  10/23/19    Authorization Type  [employer]na Medicare & [employer]    PT Start Time  0930    PT Stop Time  1010    PT Time Calculation (min)  40 min    Activity Tolerance  Patient limited by pain;Patient tolerated treatment well    Behavior During Therapy  [facility] for tasks assessed/performed       Past Medical History:  Diagnosis Date  . Arthritis   . Cancer [facility])    prostate cancer; kidney cancer  . Chronic kidney disease    kidney cancer; Left kidney removed  . Headache    cluster headaches  . Hypertension   . Myocardial infarction ([place])    x 2   . Pneumonia     Past Surgical History:  Procedure Laterality Date  . APPENDECTOMY    . CARDIAC CATHETERIZATION     x 3 stents  . EYE SURGERY Right    right eye sx.  Marland Kitchen JOINT REPLACEMENT Right    3 joint replacements  . LUMBAR LAMINECTOMY/DECOMPRESSION MICRODISCECTOMY N/A 08/15/2019   Procedure: Lumbar four-five Laminectomy/Foraminotomy;  Surgeon: Vallarie Mare, MD;  Location: [facility];  Service: Neurosurgery;  Laterality: N/A;  . TONSILLECTOMY      There were no vitals filed for this visit.   Subjective Assessment - 09/11/19 0931    Subjective  Patient reports undergoing back surgery on 08/15/19. Using 4WW intermittently as well as SPC. Pain levels have been manageable- MD advised him that his back is still healing. Pain levels are variable and not related to certain movements/activities. Mildly better with meds. Pain is located over midline of LB near incision. Denies radiation. Reports N/T  over B anterior thighs.    Pertinent History  hx prostate and kidney CA in remission, hx MI,    Limitations  Sitting;Lifting;Standing;Walking;House hold activities    How long can you sit comfortably?  variable    How long can you stand comfortably?  5 minutes limited by fatigue    How long can you walk comfortably?  less than 5 min    Diagnostic tests  none recent    Patient Stated Goals  "I want to get rid of the cane and the walker"    Currently in Pain?  Yes    Pain Score  2     Pain Location  Back    Pain Orientation  Lower   midline   Pain Descriptors / Indicators  Aching    Pain Type  Acute pain;Surgical pain         [facility] PT Assessment - 09/11/19 0940      Assessment   Medical Diagnosis  Neurogenic claudication due to lumbar spinal stenosis    Referring Provider (PT)  [name], MD    Onset Date/Surgical Date  08/15/19    Next MD Visit  pt unsure    Prior Therapy  yes- for LBP      Precautions   Precautions  --   hx of  CA; no heavy lifting     Balance Screen   Has the patient fallen in the past 6 months  No    Has the patient had a decrease in activity level because of a fear of falling?   No    Is the patient reluctant to leave their home because of a fear of falling?   No      Home Social worker  Private residence    Living Arrangements  Spouse/significant other    Available Help at Discharge  Family    Type of [employer] to enter    Entrance Stairs-Number of Steps  8    [place] to live on main level with bedroom/bathroom    [place] - 4 wheels;[place] - single point      Prior Function   Level of Independence  Independent    Vocation  Retired    Leisure  hunting, fishing      Cognition   Overall Cognitive Status  Within Functional Limits for tasks assessed      Sensation   Light Touch  Appears Intact   c/o intermittent N/T in B feet/hands       Coordination   Gross Motor Movements are Fluid and Coordinated  Yes      Posture/Postural Control   Posture/Postural Control  Postural limitations    Postural Limitations  Rounded Shoulders;Forward head;Increased thoracic kyphosis      AROM   AROM Assessment Site  --   performed holding onto mat table   Lumbar Flexion  mid shin   c/o discomfort and slight LOB   Lumbar Extension  moderately limited   discomfort   Lumbar - Right Side Bend  jt line   discomfort   Lumbar - Left Side Bend  distal thigh   discomfort   Lumbar - Right Rotation  WNL    Lumbar - Left Rotation  mildly limited      Strength   Right Hip Flexion  4/5    Right Hip ABduction  3+/5    Right Hip ADduction  4+/5    Left Hip Flexion  4/5    Left Hip ABduction  4+/5    Left Hip ADduction  4+/5    Right Knee Flexion  4+/5    Right Knee Extension  4/5    Left Knee Flexion  4+/5    Left Knee Extension  4/5    Right Ankle Dorsiflexion  4+/5    Right Ankle Plantar Flexion  4+/5    Left Ankle Dorsiflexion  4+/5    Left Ankle Plantar Flexion  4+/5      Flexibility   Soft Tissue Assessment /Muscle Length  yes    Hamstrings  B severely tight    Quadriceps  --   unable to tolerate d/t LBP   Piriformis  B moderately tight in fig 4; WNL KTOS      Palpation   Palpation comment  TTP over L4/5 incision however appears to be nearly healed      Ambulation/Gait   Assistive device  Straight cane    Gait Pattern  Step-to pattern;Step-through pattern;Abducted - left;Lateral trunk lean to right;Decreased stance time - right;Decreased step length - left;Trunk rotated posteriorly on right    Ambulation Surface  Level;Indoor    Gait velocity  decreased   poor safety with SPC-  using in R hand rather than L     Standardized Balance Assessment   Standardized Balance Assessment  Five Times Sit to Stand    Five times sit to stand comments   22.31 with B UEs pushing off from knees                Objective  measurements completed on examination: See above findings.              PT Education - 09/11/19 1219    Education Details  prognosis, POC, HEP    Person(s) Educated  Patient    Methods  Explanation;Demonstration;Tactile cues;Verbal cues;Handout    Comprehension  Verbalized understanding;Returned demonstration       PT Short Term Goals - 09/11/19 1224      PT SHORT TERM GOAL #1   Title  Patient to be independent with initial HEP.    Time  3    Period  Weeks    Status  New    Target Date  10/02/19        PT Long Term Goals - 09/11/19 1224      PT LONG TERM GOAL #1   Title  Patient to be independent with advanced HEP.    Time  6    Period  Weeks    Status  New    Target Date  10/23/19      PT LONG TERM GOAL #2   Title  Patient to demonstrate B LE strength >/=4+/5.    Time  6    Period  Weeks    Status  New    Target Date  10/23/19      PT LONG TERM GOAL #3   Title  Patient to demonstrate lumbar AROM WFL and without pain limiting.    Time  6    Period  Weeks    Status  New    Target Date  10/23/19      PT LONG TERM GOAL #4   Title  Patient to report 75% improvement in pain levels.    Time  6    Period  Weeks    Status  New    Target Date  10/23/19      PT LONG TERM GOAL #5   Title  Patient to demosntrate 5xSTS with B UE push off from knees in < 14.8 sec in order to decrease risk of falls.    Time  6    Period  Weeks    Status  New   3-4 minutes   Target Date  10/23/19      Additional Long Term Goals   Additional Long Term Goals  Yes      PT LONG TERM GOAL #6   Title  Patient to demonstrate symmetrical step length, weight shift, hip/trunk rotation, and good stability with ambulation with LRAD.    Time  6    Period  Weeks    Status  New    Target Date  10/23/19             Plan - 09/11/19 1220    Clinical Impression Statement  Patient is an 86y/o M presenting to [facility] with c/o LBP s/p L4/5 laminectomy & foraminotomy on 08/15/19. Pain  occurs over incision site and patient reports it is not influenced by movement or activity. Denies radiation but does endorse N/T over B anterior thighs. Patient currently ambulating with 4WW or SPC, and would like to wean off both AD's eventually.  Patient today presenting with forward head and rounded/flexed posture, limited and uncomfortable lumbar AROM, decreased B LE strength, tightness in B hip flexors and piriformis, TTP over incision site, and gait deviations. Patient's score on 5xSTS indicates in increased risk of falls. Patient educated on gentle lumbopelvic ROM and LE stretching HEP- patient reported understanding. Would benefit from skilled PT services 2x/week for 6 weeks to address aforementioned impairments.    Personal Factors and Comorbidities  Age;Comorbidity 3+;Fitness;Past/Current Experience;Time since onset of injury/illness/exacerbation    Comorbidities  CKD, HTN, LBP, HLD, R hip OA, hx prostate and kidney CA in remission    Examination-Activity Limitations  Sit;Sleep;Bend;Squat;Caring for Others;Stairs;Carry;Stand;Toileting;Dressing;Transfers;Hygiene/Grooming;Lift;Locomotion Level;Reach Overhead    Examination-Participation Restrictions  Church;Cleaning;Shop;Community Activity;Driving;Yard Work;Interpersonal Relationship;Laundry;Meal Prep    Stability/Clinical Decision Making  Stable/Uncomplicated    Clinical Decision Making  Low    Rehab Potential  Good    PT Frequency  2x / week    PT Duration  6 weeks    PT Treatment/Interventions  ADLs/Self Care Home Management;Cryotherapy;Moist Heat;Traction;Balance training;Therapeutic exercise;Therapeutic activities;Functional mobility training;Stair training;Gait training;DME Instruction;Neuromuscular re-education;Patient/family education;Manual techniques;Taping;Energy conservation;Dry needling;Passive range of motion    PT Next Visit Plan  lumbar FOTO; reassess HEP    Consulted and Agree with Plan of Care  Patient       Patient will  benefit from skilled therapeutic intervention in order to improve the following deficits and impairments:  Abnormal gait, Decreased activity tolerance, Decreased strength, Increased fascial restricitons, Pain, Increased muscle spasms, Difficulty walking, Decreased range of motion, Improper body mechanics, Postural dysfunction, Impaired flexibility, Decreased balance, Decreased endurance, Decreased scar mobility  Visit Diagnosis: Acute midline low back pain without sciatica  Muscle weakness (generalized)  Difficulty in walking, not elsewhere classified  Other abnormalities of gait and mobility     Problem List There are no problems to display for this patient.    Janene Harvey, PT, DPT 09/11/19 12:28 PM   [facility] [address]  [place], [place], [address] Phone: [phone]   Fax:  [phone]  Name: Craig Werner MRN: [id number] Date of Birth: [birth year]

## 2019-09-17 ENCOUNTER — Encounter: Payer: Self-pay | Admitting: Physical Therapy

## 2019-09-17 ENCOUNTER — Ambulatory Visit: Payer: No Typology Code available for payment source | Admitting: Physical Therapy

## 2019-09-17 ENCOUNTER — Other Ambulatory Visit: Payer: Self-pay

## 2019-09-17 DIAGNOSIS — R262 Difficulty in walking, not elsewhere classified: Secondary | ICD-10-CM

## 2019-09-17 DIAGNOSIS — M545 Low back pain, unspecified: Secondary | ICD-10-CM

## 2019-09-17 DIAGNOSIS — R2689 Other abnormalities of gait and mobility: Secondary | ICD-10-CM

## 2019-09-17 DIAGNOSIS — M6281 Muscle weakness (generalized): Secondary | ICD-10-CM

## 2019-09-17 NOTE — Therapy (Signed)
[facility] [address]  [place], [place], [address] Phone: [phone]   Fax:  [phone]  Physical Therapy Treatment  Patient Details  Name: Craig Werner MRN: [id number] Date of Birth: 06/13/1933 Referring Provider (PT): [name], MD   Encounter Date: 09/17/2019  PT End of Session - 09/17/19 1523    Visit Number  2    Number of Visits  13    Date for PT Re-Evaluation  10/23/19    Authorization Type  Aetna Medicare & VA    PT Start Time  1401    PT Stop Time  1445    PT Time Calculation (min)  44 min    Activity Tolerance  Patient tolerated treatment well    Behavior During Therapy  [facility] for tasks assessed/performed       Past Medical History:  Diagnosis Date  . Arthritis   . Cancer [facility])    prostate cancer; kidney cancer  . Chronic kidney disease    kidney cancer; Left kidney removed  . Headache    cluster headaches  . Hypertension   . Myocardial infarction ([place])    x 2   . Pneumonia     Past Surgical History:  Procedure Laterality Date  . APPENDECTOMY    . CARDIAC CATHETERIZATION     x 3 stents  . EYE SURGERY Right    right eye sx.  Marland Kitchen JOINT REPLACEMENT Right    3 joint replacements  . LUMBAR LAMINECTOMY/DECOMPRESSION MICRODISCECTOMY N/A 08/15/2019   Procedure: Lumbar four-five Laminectomy/Foraminotomy;  Surgeon: Vallarie Mare, MD;  Location: [facility];  Service: Neurosurgery;  Laterality: N/A;  . TONSILLECTOMY      There were no vitals filed for this visit.  Subjective Assessment - 09/17/19 1403    Subjective  Back continues to be sore. Has been performing HEP.    Pertinent History  hx prostate and kidney CA in remission, hx MI,    Diagnostic tests  none recent    Patient Stated Goals  "I want to get rid of the cane and the walker"    Currently in Pain?  Yes    Pain Score  2     Pain Location  Back    Pain Orientation  Lower    Pain Descriptors / Indicators  Dull    Pain  Type  Acute pain;Surgical pain                       [facility] Adult PT Treatment/Exercise - 09/17/19 0001      Exercises   Exercises  Lumbar;Knee/Hip      Lumbar Exercises: Stretches   Passive Hamstring Stretch  Right;Left;30 seconds;2 reps    Passive Hamstring Stretch Limitations  sitting with heel on step    Lower Trunk Rotation Limitations  x20 to tolerance    Piriformis Stretch  2 reps;30 seconds    Piriformis Stretch Limitations  supine KTOS    Figure 4 Stretch  2 reps;30 seconds;With overpressure    Figure 4 Stretch Limitations  supine; each LE      Lumbar Exercises: Aerobic   Nustep  L3x 47min (UEs/LEs)      Lumbar Exercises: Supine   Pelvic Tilt  10 reps    Pelvic Tilt Limitations  tactile cues for proper motion    Dead Bug  10 reps    Dead Bug Limitations  orange pball on belly   difficulty with coordination  Bridge  10 reps    Bridge Limitations  2x10; cues to avoid R valgus collapse      Knee/Hip Exercises: Seated   Clamshell with TheraBand  Red   x10; cues to increased ROM on R   Sit to Sand  2 sets;5 reps;without UE support   2nd set with red loop above knees            PT Education - 09/17/19 1523    Education Details  update to HEP    Person(s) Educated  Patient    Methods  Explanation;Demonstration;Tactile cues;Verbal cues;Handout    Comprehension  Verbalized understanding;Returned demonstration       PT Short Term Goals - 09/17/19 1525      PT SHORT TERM GOAL #1   Title  Patient to be independent with initial HEP.    Time  3    Period  Weeks    Status  Achieved    Target Date  10/02/19        PT Long Term Goals - 09/17/19 1525      PT LONG TERM GOAL #1   Title  Patient to be independent with advanced HEP.    Time  6    Period  Weeks    Status  On-going      PT LONG TERM GOAL #2   Title  Patient to demonstrate B LE strength >/=4+/5.    Time  6    Period  Weeks    Status  On-going      PT LONG TERM GOAL #3    Title  Patient to demonstrate lumbar AROM WFL and without pain limiting.    Time  6    Period  Weeks    Status  On-going      PT LONG TERM GOAL #4   Title  Patient to report 75% improvement in pain levels.    Time  6    Period  Weeks    Status  On-going      PT LONG TERM GOAL #5   Title  Patient to demosntrate 5xSTS with B UE push off from knees in < 14.8 sec in order to decrease risk of falls.    Time  6    Period  Weeks    Status  On-going   3-4 minutes     PT LONG TERM GOAL #6   Title  Patient to demonstrate symmetrical step length, weight shift, hip/trunk rotation, and good stability with ambulation with LRAD.    Time  6    Period  Weeks    Status  On-going            Plan - 09/17/19 1524    Clinical Impression Statement  Patient reporting continued soreness in low back. Noting compliance with HEP and denying questions. Reviewed HEP with patient demonstrating good carryover. Still demonstrating considerable B HS tightness with stretching. Worked on STS transfers with cueing for proper positioning and set up and to promote quad activation upon standing to avoid L knee buckling. Patient performed gentle LE and lumbopelvic stretching without complaints. Able to perform bridges with hip instability apparent and cues required to avoid R valgus collapse with good effort to correct. Updated this exercise into HEP as patient tolerated it well- patient reported understanding and without complaints at end of session.    Comorbidities  CKD, HTN, LBP, HLD, R hip OA, hx prostate and kidney CA in remission    Rehab Potential  Good  PT Frequency  2x / week    PT Duration  6 weeks    PT Treatment/Interventions  ADLs/Self Care Home Management;Cryotherapy;Moist Heat;Traction;Balance training;Therapeutic exercise;Therapeutic activities;Functional mobility training;Stair training;Gait training;DME Instruction;Neuromuscular re-education;Patient/family education;Manual techniques;Taping;Energy  conservation;Dry needling;Passive range of motion    PT Next Visit Plan  lumbar FOTO; progress hip and core strengthening    Consulted and Agree with Plan of Care  Patient       Patient will benefit from skilled therapeutic intervention in order to improve the following deficits and impairments:  Abnormal gait, Decreased activity tolerance, Decreased strength, Increased fascial restricitons, Pain, Increased muscle spasms, Difficulty walking, Decreased range of motion, Improper body mechanics, Postural dysfunction, Impaired flexibility, Decreased balance, Decreased endurance, Decreased scar mobility  Visit Diagnosis: Acute midline low back pain without sciatica  Muscle weakness (generalized)  Difficulty in walking, not elsewhere classified  Other abnormalities of gait and mobility     Problem List There are no problems to display for this patient.     Janene Harvey, PT, DPT 09/17/19 3:27 PM   [facility] [address]  [place], [place], [address] Phone: [phone]   Fax:  [phone]  Name: GARREL STEEGE MRN: [id number] Date of Birth: 1933-08-03

## 2019-09-18 ENCOUNTER — Ambulatory Visit: Payer: No Typology Code available for payment source | Admitting: Physical Therapy

## 2019-09-18 ENCOUNTER — Encounter: Payer: Self-pay | Admitting: Physical Therapy

## 2019-09-18 DIAGNOSIS — R2689 Other abnormalities of gait and mobility: Secondary | ICD-10-CM

## 2019-09-18 DIAGNOSIS — M545 Low back pain, unspecified: Secondary | ICD-10-CM

## 2019-09-18 DIAGNOSIS — R262 Difficulty in walking, not elsewhere classified: Secondary | ICD-10-CM

## 2019-09-18 DIAGNOSIS — M6281 Muscle weakness (generalized): Secondary | ICD-10-CM

## 2019-09-18 NOTE — Therapy (Signed)
[facility] [address]  [place], [place], [address] Phone: [phone]   Fax:  [phone]  Physical Therapy Treatment  Patient Details  Name: Craig Werner MRN: [id number] Date of Birth: 01-Jul-1933 Referring Provider (PT): [name], MD   Encounter Date: 09/18/2019  PT End of Session - 09/18/19 1430    Visit Number  3    Number of Visits  13    Date for PT Re-Evaluation  10/23/19    Authorization Type  [employer] Medicare & [employer]    PT Start Time  1401    PT Stop Time  1429   pt requesting to leave early for another obligation   PT Time Calculation (min)  28 min    Activity Tolerance  Patient tolerated treatment well;Patient limited by pain    Behavior During Therapy  [facility] for tasks assessed/performed       Past Medical History:  Diagnosis Date  . Arthritis   . Cancer [facility])    prostate cancer; kidney cancer  . Chronic kidney disease    kidney cancer; Left kidney removed  . Headache    cluster headaches  . Hypertension   . Myocardial infarction ([place])    x 2   . Pneumonia     Past Surgical History:  Procedure Laterality Date  . APPENDECTOMY    . CARDIAC CATHETERIZATION     x 3 stents  . EYE SURGERY Right    right eye sx.  Marland Kitchen JOINT REPLACEMENT Right    3 joint replacements  . LUMBAR LAMINECTOMY/DECOMPRESSION MICRODISCECTOMY N/A 08/15/2019   Procedure: Lumbar four-five Laminectomy/Foraminotomy;  Surgeon: Vallarie Mare, MD;  Location: [place];  Service: Neurosurgery;  Laterality: N/A;  . TONSILLECTOMY      There were no vitals filed for this visit.  Subjective Assessment - 09/18/19 1404    Subjective  Felt a little sore after last session.    Pertinent History  hx prostate and kidney CA in remission, hx MI,    Diagnostic tests  none recent    Patient Stated Goals  "I want to get rid of the cane and the walker"    Currently in Pain?  No/denies                       [facility] Adult  PT Treatment/Exercise - 09/18/19 0001      Lumbar Exercises: Aerobic   Nustep  L3x 70min (LEs)      Lumbar Exercises: Supine   Other Supine Lumbar Exercises  resisted isometric B hip flexion with LEs on orange pball 10x5"   cues to avoid maximal push and valsalva     Knee/Hip Exercises: Standing   Hip Extension  Stengthening;Right;Left;1 set;10 reps;Knee straight    Extension Limitations  unable on L LE d/t R sided LBP- discontonued    Wall Squat  1 set;10 reps    Wall Squat Limitations  mini squat at TM rail      Knee/Hip Exercises: Seated   Sit to Sand  2 sets;5 reps;without UE support   5x pushing off knees, 5x holding ball at chest              PT Short Term Goals - 09/17/19 1525      PT SHORT TERM GOAL #1   Title  Patient to be independent with initial HEP.    Time  3    Period  Weeks  Status  Achieved    Target Date  10/02/19        PT Long Term Goals - 09/17/19 1525      PT LONG TERM GOAL #1   Title  Patient to be independent with advanced HEP.    Time  6    Period  Weeks    Status  On-going      PT LONG TERM GOAL #2   Title  Patient to demonstrate B LE strength >/=4+/5.    Time  6    Period  Weeks    Status  On-going      PT LONG TERM GOAL #3   Title  Patient to demonstrate lumbar AROM WFL and without pain limiting.    Time  6    Period  Weeks    Status  On-going      PT LONG TERM GOAL #4   Title  Patient to report 75% improvement in pain levels.    Time  6    Period  Weeks    Status  On-going      PT LONG TERM GOAL #5   Title  Patient to demosntrate 5xSTS with B UE push off from knees in < 14.8 sec in order to decrease risk of falls.    Time  6    Period  Weeks    Status  On-going   3-4 minutes     PT LONG TERM GOAL #6   Title  Patient to demonstrate symmetrical step length, weight shift, hip/trunk rotation, and good stability with ambulation with LRAD.    Time  6    Period  Weeks    Status  On-going            Plan -  09/18/19 1431    Clinical Impression Statement  Patient reporting soreness in LB after last session. Continued working on STS transfers without UE use. Patient demonstrated good form without use of hands, only requiring intermittent cues for increasing anterior trunk lean. Worked on core and proximal hip strengthening with cues to avoid maximal exertion and Valsalva maneuver. Reviewed log roll as patient struggling to sit up from supine. Required min A for this transfer d/t limited UE strength/leverage. Reporting R sided LBP with standing hip extension, thus this was discontinued. No further complaints at end of session. Session shortened today d/t patient's request as he had another obligation.    Comorbidities  CKD, HTN, LBP, HLD, R hip OA, hx prostate and kidney CA in remission    Rehab Potential  Good    PT Frequency  2x / week    PT Duration  6 weeks    PT Treatment/Interventions  ADLs/Self Care Home Management;Cryotherapy;Moist Heat;Traction;Balance training;Therapeutic exercise;Therapeutic activities;Functional mobility training;Stair training;Gait training;DME Instruction;Neuromuscular re-education;Patient/family education;Manual techniques;Taping;Energy conservation;Dry needling;Passive range of motion    PT Next Visit Plan  lumbar FOTO; progress hip and core strengthening    Consulted and Agree with Plan of Care  Patient       Patient will benefit from skilled therapeutic intervention in order to improve the following deficits and impairments:  Abnormal gait, Decreased activity tolerance, Decreased strength, Increased fascial restricitons, Pain, Increased muscle spasms, Difficulty walking, Decreased range of motion, Improper body mechanics, Postural dysfunction, Impaired flexibility, Decreased balance, Decreased endurance, Decreased scar mobility  Visit Diagnosis: Acute midline low back pain without sciatica  Muscle weakness (generalized)  Difficulty in walking, not elsewhere  classified  Other abnormalities of gait and mobility  Problem List There are no problems to display for this patient.     Janene Harvey, PT, DPT 09/18/19 2:34 PM   [facility] [address]  [place], [place], [address] Phone: [phone]   Fax:  [phone]  Name: Craig Werner MRN: [id number] Date of Birth: Jan 18, 1934

## 2019-09-24 ENCOUNTER — Other Ambulatory Visit: Payer: Self-pay

## 2019-09-24 ENCOUNTER — Ambulatory Visit: Payer: No Typology Code available for payment source | Admitting: Physical Therapy

## 2019-09-24 ENCOUNTER — Encounter: Payer: Self-pay | Admitting: Physical Therapy

## 2019-09-24 DIAGNOSIS — R2689 Other abnormalities of gait and mobility: Secondary | ICD-10-CM

## 2019-09-24 DIAGNOSIS — M545 Low back pain, unspecified: Secondary | ICD-10-CM

## 2019-09-24 DIAGNOSIS — R262 Difficulty in walking, not elsewhere classified: Secondary | ICD-10-CM

## 2019-09-24 DIAGNOSIS — M6281 Muscle weakness (generalized): Secondary | ICD-10-CM

## 2019-09-24 NOTE — Therapy (Signed)
[facility] [address]  [place], [place], [address] Phone: [phone]   Fax:  [phone]  Physical Therapy Treatment  Patient Details  Name: Craig Werner MRN: [id number] Date of Birth: 1934-02-23 Referring Provider (PT): [name], MD   Encounter Date: 09/24/2019  PT End of Session - 09/24/19 1447    Visit Number  4    Number of Visits  13    Date for PT Re-Evaluation  10/23/19    Authorization Type  [employer]na Medicare & VA    PT Start Time  [id number]    PT Stop Time  1400    PT Time Calculation (min)  43 min    Activity Tolerance  Patient tolerated treatment well;Patient limited by pain    Behavior During Therapy  [facility] for tasks assessed/performed       Past Medical History:  Diagnosis Date  . Arthritis   . Cancer [facility])    prostate cancer; kidney cancer  . Chronic kidney disease    kidney cancer; Left kidney removed  . Headache    cluster headaches  . Hypertension   . Myocardial infarction ([place])    x 2   . Pneumonia     Past Surgical History:  Procedure Laterality Date  . APPENDECTOMY    . CARDIAC CATHETERIZATION     x 3 stents  . EYE SURGERY Right    right eye sx.  Marland Kitchen JOINT REPLACEMENT Right    3 joint replacements  . LUMBAR LAMINECTOMY/DECOMPRESSION MICRODISCECTOMY N/A 08/15/2019   Procedure: Lumbar four-five Laminectomy/Foraminotomy;  Surgeon: Vallarie Mare, MD;  Location: [place];  Service: Neurosurgery;  Laterality: N/A;  . TONSILLECTOMY      There were no vitals filed for this visit.  Subjective Assessment - 09/24/19 1402    Subjective  Still has a little spot on his back that hurts all the time. Is curious to know if he has a leg length discrepancy.    Pertinent History  hx prostate and kidney CA in remission, hx MI,    Diagnostic tests  none recent    Patient Stated Goals  "I want to get rid of the cane and the walker"    Currently in Pain?  No/denies                        [facility] Adult PT Treatment/Exercise - 09/24/19 0001      Lumbar Exercises: Aerobic   Nustep  L3x 60min (LEs/UEs)      Lumbar Exercises: Standing   Heel Raises  15 reps    Heel Raises Limitations  at TM rail    Functional Squats  10 reps    Functional Squats Limitations  at TM rail; cues to sit back onto heels and avoid valgus collapse      Knee/Hip Exercises: Stretches   Piriformis Stretch  Right;Both;1 rep;30 seconds    Piriformis Stretch Limitations  sitting fig 4     Other Knee/Hip Stretches  R/L sitting KTOS stretch 30" each LE      Knee/Hip Exercises: Seated   Long Arc Quad  Strengthening;Left;1 set;10 reps;Right    Long Arc Quad Limitations  red loop around ankles   c/o R hip pain but insistent to continue   Sit to [employer]  2 sets;5 reps;without UE support   holding ball at chest            PT Education - 09/24/19  [id number]    Education Details  discussion on patient's current symptoms with agreement on 30 day hold next session until f/u with surgeon    Person(s) Educated  Patient    Methods  Explanation;Demonstration;Tactile cues;Verbal cues;Handout    Comprehension  Verbalized understanding;Returned demonstration       PT Short Term Goals - 09/17/19 1525      PT SHORT TERM GOAL #1   Title  Patient to be independent with initial HEP.    Time  3    Period  Weeks    Status  Achieved    Target Date  10/02/19        PT Long Term Goals - 09/17/19 1525      PT LONG TERM GOAL #1   Title  Patient to be independent with advanced HEP.    Time  6    Period  Weeks    Status  On-going      PT LONG TERM GOAL #2   Title  Patient to demonstrate B LE strength >/=4+/5.    Time  6    Period  Weeks    Status  On-going      PT LONG TERM GOAL #3   Title  Patient to demonstrate lumbar AROM WFL and without pain limiting.    Time  6    Period  Weeks    Status  On-going      PT LONG TERM GOAL #4   Title  Patient to report 75% improvement  in pain levels.    Time  6    Period  Weeks    Status  On-going      PT LONG TERM GOAL #5   Title  Patient to demosntrate 5xSTS with B UE push off from knees in < 14.8 sec in order to decrease risk of falls.    Time  6    Period  Weeks    Status  On-going   3-4 minutes     PT LONG TERM GOAL #6   Title  Patient to demonstrate symmetrical step length, weight shift, hip/trunk rotation, and good stability with ambulation with LRAD.    Time  6    Period  Weeks    Status  On-going            Plan - 09/24/19 1812    Clinical Impression Statement  Patient reporting continued R sided hip/LBP. Expressing interest in assessing leg length discrepancy as he has been told that his L leg is shorter than R. Leg length measurements showed 0.5 cm discrepancy, with L LE being the longer leg. Advised patient to try taking out his current heel lift in his L shoe to assess for comfort. Patient performed STS with L knee still intermittently buckling. Performance improved with increased reps. Reported R hip pain with sitting LAQ. Offered to discontinue, however patient insistent on continuing with this exercise. Worked on standing LE strengthening with patient requiring intermittent cues for proper form and alignment. Patient again reporting R posterolateral hip pain, thus ended with stretching for relief. Patient noting that he would like to be placed on a 30 day hold next session until he is able to f/u with surgeon about his hip pain. Offered moist heat at end of session for pain relief- patient declined. Ended session without further complaints.    Comorbidities  CKD, HTN, LBP, HLD, R hip OA, hx prostate and kidney CA in remission    Rehab Potential  Good  PT Frequency  2x / week    PT Duration  6 weeks    PT Treatment/Interventions  ADLs/Self Care Home Management;Cryotherapy;Moist Heat;Traction;Balance training;Therapeutic exercise;Therapeutic activities;Functional mobility training;Stair training;Gait  training;DME Instruction;Neuromuscular re-education;Patient/family education;Manual techniques;Taping;Energy conservation;Dry needling;Passive range of motion    PT Next Visit Plan  progress hip and core strengthening    Consulted and Agree with Plan of Care  Patient       Patient will benefit from skilled therapeutic intervention in order to improve the following deficits and impairments:  Abnormal gait, Decreased activity tolerance, Decreased strength, Increased fascial restricitons, Pain, Increased muscle spasms, Difficulty walking, Decreased range of motion, Improper body mechanics, Postural dysfunction, Impaired flexibility, Decreased balance, Decreased endurance, Decreased scar mobility  Visit Diagnosis: Acute midline low back pain without sciatica  Muscle weakness (generalized)  Difficulty in walking, not elsewhere classified  Other abnormalities of gait and mobility     Problem List There are no problems to display for this patient.    Janene Harvey, PT, DPT 09/24/19 6:17 PM   [facility] [address]  [place], [place], [address] Phone: [phone]   Fax:  [phone]  Name: Craig Werner MRN: [id number] Date of Birth: 1934/04/20

## 2019-09-26 ENCOUNTER — Ambulatory Visit: Payer: No Typology Code available for payment source | Admitting: Physical Therapy

## 2019-09-26 ENCOUNTER — Encounter: Payer: Self-pay | Admitting: Physical Therapy

## 2019-09-26 ENCOUNTER — Other Ambulatory Visit: Payer: Self-pay

## 2019-09-26 DIAGNOSIS — M545 Low back pain, unspecified: Secondary | ICD-10-CM

## 2019-09-26 DIAGNOSIS — R2689 Other abnormalities of gait and mobility: Secondary | ICD-10-CM

## 2019-09-26 DIAGNOSIS — R262 Difficulty in walking, not elsewhere classified: Secondary | ICD-10-CM

## 2019-09-26 DIAGNOSIS — M6281 Muscle weakness (generalized): Secondary | ICD-10-CM

## 2019-09-26 NOTE — Therapy (Addendum)
[facility] [address]  [place], [place], [address] Phone: [phone]   Fax:  [phone]  Physical Therapy Treatment  Patient Details  Name: Craig Werner MRN: [id number] Date of Birth: February 24, 1934 Referring Provider (PT): [name], MD    Progress Note Reporting Period 09/11/19 to 09/26/19  See note below for Objective Data and Assessment of Progress/Goals.     Encounter Date: 09/26/2019  PT End of Session - 09/26/19 1435    Visit Number  5    Number of Visits  13    Date for PT Re-Evaluation  10/23/19    Authorization Type  Aetna Medicare & VA    PT Start Time  1400    PT Stop Time  1432    PT Time Calculation (min)  32 min    Activity Tolerance  Patient tolerated treatment well    Behavior During Therapy  [facility] for tasks assessed/performed       Past Medical History:  Diagnosis Date  . Arthritis   . Cancer [facility])    prostate cancer; kidney cancer  . Chronic kidney disease    kidney cancer; Left kidney removed  . Headache    cluster headaches  . Hypertension   . Myocardial infarction ([place])    x 2   . Pneumonia     Past Surgical History:  Procedure Laterality Date  . APPENDECTOMY    . CARDIAC CATHETERIZATION     x 3 stents  . EYE SURGERY Right    right eye sx.  Marland Kitchen JOINT REPLACEMENT Right    3 joint replacements  . LUMBAR LAMINECTOMY/DECOMPRESSION MICRODISCECTOMY N/A 08/15/2019   Procedure: Lumbar four-five Laminectomy/Foraminotomy;  Surgeon: Vallarie Mare, MD;  Location: [place];  Service: Neurosurgery;  Laterality: N/A;  . TONSILLECTOMY      There were no vitals filed for this visit.  Subjective Assessment - 09/26/19 1401    Subjective  Not much is new. Would like to wrap up with PT today. Reports 40% improvement since starting PT.    Pertinent History  hx prostate and kidney CA in remission, hx MI,    Diagnostic tests  none recent    Patient Stated Goals  "I want to get rid  of the cane and the walker"    Currently in Pain?  No/denies         [facility] PT Assessment - 09/26/19 0001      AROM   Lumbar Flexion  ankles   mild LBP   Lumbar Extension  mildly limited   mild LBP   Lumbar - Right Side Bend  distal thigh   discomfort   Lumbar - Left Side Bend  distal thigh   discomfort   Lumbar - Right Rotation  WNL    Lumbar - Left Rotation  mildly limited      Strength   Right Hip Flexion  4+/5    Right Hip ABduction  4/5    Right Hip ADduction  4+/5    Left Hip Flexion  4+/5    Left Hip ABduction  4+/5    Left Hip ADduction  4+/5    Right Knee Flexion  4+/5    Right Knee Extension  4+/5    Left Knee Flexion  4+/5    Left Knee Extension  4+/5    Right Ankle Dorsiflexion  4+/5    Right Ankle Plantar Flexion  4+/5    Left Ankle Dorsiflexion  4+/5  Left Ankle Plantar Flexion  4+/5      Standardized Balance Assessment   Five times sit to stand comments   23.06 with B UEs pushing off from knees                   [facility] Adult PT Treatment/Exercise - 09/26/19 0001      Lumbar Exercises: Aerobic   Nustep  L5x 26mn (LEs/UEs)             PT Education - 09/26/19 1435    Education Details  update/consolidation of HEP; discussion on objective progress and 30 day hold    Person(s) Educated  Patient    Methods  Explanation;Demonstration;Tactile cues;Verbal cues;Handout    Comprehension  Verbalized understanding;Returned demonstration       PT Short Term Goals - 09/26/19 1404      PT SHORT TERM GOAL #1   Title  Patient to be independent with initial HEP.    Time  3    Period  Weeks    Status  Achieved    Target Date  10/02/19        PT Long Term Goals - 09/26/19 1405      PT LONG TERM GOAL #1   Title  Patient to be independent with advanced HEP.    Time  6    Period  Weeks    Status  Achieved      PT LONG TERM GOAL #2   Title  Patient to demonstrate B LE strength >/=4+/5.    Time  6    Period  Weeks    Status   Partially Met   improved in R hip abduction, B hip flexion, B knee extension     PT LONG TERM GOAL #3   Title  Patient to demonstrate lumbar AROM WFL and without pain limiting.    Time  6    Period  Weeks    Status  Partially Met   improved in flexion and extension but with mild pain/discomfort remaining     PT LONG TERM GOAL #4   Title  Patient to report 75% improvement in pain levels.    Time  6    Period  Weeks    Status  Partially Met   reports 50 % improvement     PT LONG TERM GOAL #5   Title  Patient to demosntrate 5xSTS with B UE push off from knees in < 14.8 sec in order to decrease risk of falls.    Time  6    Period  Weeks    Status  Not Met   23.06 sec     PT LONG TERM GOAL #6   Title  Patient to demonstrate symmetrical step length, weight shift, hip/trunk rotation, and good stability with ambulation with LRAD.    Time  6    Period  Weeks    Status  Not Met   gait pattern unchange- still demonstrating considerable R lateral trunk lean           Plan - 09/26/19 1437    Clinical Impression Statement  Patient reports 40% improvement since initial eval. Noting that he would like to wrap up with PT today until he is able to f/u with MD 10/14/19. Strength testing revealed improved in R hip abduction, B hip flexion, B knee extension. R hip abduction most limiting factor. Lumbar AROM has improved in flexion and extension but with mild pain/discomfort remaining. Gait pattern with SPC is  unchanged, as patient is still demonstrating considerable R lateral trunk lean. However, this was evident pre-operatively as well and is likely baseline for the patient. Patient's score on 5xSTS unchanged today. Consolidated HEP with previously well-tolerated exercises for max benefit. Patient reported understanding and without complaints at end of session. Patient has demonstrated fair improvement with therapy. Placing patient on 30 day hold at this time per his request.    Comorbidities   CKD, HTN, LBP, HLD, R hip OA, hx prostate and kidney CA in remission    Rehab Potential  Good    PT Frequency  2x / week    PT Duration  6 weeks    PT Treatment/Interventions  ADLs/Self Care Home Management;Cryotherapy;Moist Heat;Traction;Balance training;Therapeutic exercise;Therapeutic activities;Functional mobility training;Stair training;Gait training;DME Instruction;Neuromuscular re-education;Patient/family education;Manual techniques;Taping;Energy conservation;Dry needling;Passive range of motion    PT Next Visit Plan  30 day hold at this time    Consulted and Agree with Plan of Care  Patient       Patient will benefit from skilled therapeutic intervention in order to improve the following deficits and impairments:  Abnormal gait, Decreased activity tolerance, Decreased strength, Increased fascial restricitons, Pain, Increased muscle spasms, Difficulty walking, Decreased range of motion, Improper body mechanics, Postural dysfunction, Impaired flexibility, Decreased balance, Decreased endurance, Decreased scar mobility  Visit Diagnosis: Acute midline low back pain without sciatica  Muscle weakness (generalized)  Difficulty in walking, not elsewhere classified  Other abnormalities of gait and mobility     Problem List There are no problems to display for this patient.     Janene Harvey, PT, DPT 09/26/19 2:42 PM   [facility] [address]  [place], [place], [address] Phone: [phone]   Fax:  [phone]  Name: Craig Werner MRN: [id number] Date of Birth: 04/20/1934  PHYSICAL THERAPY DISCHARGE SUMMARY  Visits from Start of Care: 5  Current functional level related to goals / functional outcomes: See above clinical impression; patient did not return during 30 day hold periods   Remaining deficits: Gait deviations, LE weakness, limited lumbar ROM, pain    Education / Equipment: HEP   Plan: Patient agrees to discharge.  Patient goals were partially met. Patient is being discharged due to the patient's request.  ?????     Janene Harvey, PT, DPT 10/31/19 1:44 PM

## 2019-12-12 ENCOUNTER — Other Ambulatory Visit: Payer: Self-pay | Admitting: Neurosurgery

## 2019-12-20 NOTE — Progress Notes (Signed)
Your procedure is scheduled on Wednesday, July 28th.  Report to [facility] Main Entrance "A" at 5:30 A.M., and check in at the Admitting office.  Call this number if you have problems the morning of surgery:  [phone]  Call [phone] if you have any questions prior to your surgery date Monday-Friday 8am-4pm  Remember:  Do not eat or drink after midnight the night before your surgery   Take these medicines the morning of surgery with A SIP OF WATER  amLODipine (NORVASC)  atorvastatin (LIPITOR) metoprolol tartrate (LOPRESSOR)   If needed - acetaminophen (TYLENOL), docusate sodium (COLACE),  albuterol (VENTOLIN HFA)/inhaler - bring with you the morning of surgery  Follow your surgeon's instructions on when to stop Aspirin.  If no instructions were given by your surgeon then you will need to call the office to get those instructions.     As of today, STOP taking Aleve, Naproxen, Ibuprofen, Motrin, Advil, Goody's, BC's, all herbal medications, fish oil, and all vitamins.             Do not wear jewelry.            Do not wear lotions, powders, perfumes/colognes, or deodorant.            Men may shave face and neck.            Do not bring valuables to the hospital.            [facility] is not responsible for any belongings or valuables.  Do NOT Smoke (Tobacco/Vaping) or drink Alcohol 24 hours prior to your procedure If you use a CPAP at night, you may bring all equipment for your overnight stay.   Contacts, glasses, dentures or bridgework may not be worn into surgery.      For patients admitted to the hospital, discharge time will be determined by your treatment team.   Patients discharged the day of surgery will not be allowed to drive home, and someone needs to stay with them for 24 hours.  Special instructions:   [place]- Preparing For Surgery  Before surgery, you can play an important role. Because skin is not sterile, your skin needs to be as free of germs as  possible. You can reduce the number of germs on your skin by washing with CHG (chlorahexidine gluconate) Soap before surgery.  CHG is an antiseptic cleaner which kills germs and bonds with the skin to continue killing germs even after washing.    Oral Hygiene is also important to reduce your risk of infection.  Remember - BRUSH YOUR TEETH THE MORNING OF SURGERY WITH YOUR REGULAR TOOTHPASTE  Please do not use if you have an allergy to CHG or antibacterial soaps. If your skin becomes reddened/irritated stop using the CHG.  Do not shave (including legs and underarms) for at least 48 hours prior to first CHG shower. It is OK to shave your face.  Please follow these instructions carefully.   1. Shower the NIGHT BEFORE SURGERY and the MORNING OF SURGERY with CHG Soap.   2. If you chose to wash your hair, wash your hair first as usual with your normal shampoo.  3. After you shampoo, rinse your hair and body thoroughly to remove the shampoo.  4. Use CHG as you would any other liquid soap. You can apply CHG directly to the skin and wash gently with a scrungie or a clean washcloth.   5. Apply the CHG Soap to your body ONLY FROM  THE NECK DOWN.  Do not use on open wounds or open sores. Avoid contact with your eyes, ears, mouth and genitals (private parts). Wash Face and genitals (private parts)  with your normal soap.   6. Wash thoroughly, paying special attention to the area where your surgery will be performed.  7. Thoroughly rinse your body with warm water from the neck down.  8. DO NOT shower/wash with your normal soap after using and rinsing off the CHG Soap.  9. Pat yourself dry with a CLEAN TOWEL.  10. Wear CLEAN PAJAMAS to bed the night before surgery  11. Place CLEAN SHEETS on your bed the night of your first shower and DO NOT SLEEP WITH PETS.  Day of Surgery: Wear Clean/Comfortable clothing the morning of surgery Do not apply any deodorants/lotions.   Remember to brush your teeth WITH  YOUR REGULAR TOOTHPASTE.   Please read over the following fact sheets that you were given.

## 2019-12-23 ENCOUNTER — Encounter (HOSPITAL_COMMUNITY)
Admission: RE | Admit: 2019-12-23 | Discharge: 2019-12-23 | Disposition: A | Discharge status: Home / Self Care | Payer: No Typology Code available for payment source | Source: Ambulatory Visit | Attending: Neurosurgery | Admitting: Neurosurgery

## 2019-12-23 ENCOUNTER — Encounter (HOSPITAL_COMMUNITY): Payer: Self-pay

## 2019-12-23 ENCOUNTER — Other Ambulatory Visit: Payer: Self-pay | Admitting: Neurosurgery

## 2019-12-23 ENCOUNTER — Other Ambulatory Visit (HOSPITAL_COMMUNITY)
Admission: RE | Admit: 2019-12-23 | Discharge: 2019-12-23 | Disposition: A | Discharge status: Home / Self Care | Payer: No Typology Code available for payment source | Source: Ambulatory Visit | Attending: Neurosurgery | Admitting: Neurosurgery

## 2019-12-23 ENCOUNTER — Other Ambulatory Visit: Payer: Self-pay

## 2019-12-23 DIAGNOSIS — Z20822 Contact with and (suspected) exposure to covid-19: Secondary | ICD-10-CM | POA: Insufficient documentation

## 2019-12-23 DIAGNOSIS — Z01812 Encounter for preprocedural laboratory examination: Secondary | ICD-10-CM | POA: Insufficient documentation

## 2019-12-23 HISTORY — DX: Atherosclerotic heart disease of native coronary artery without angina pectoris: I25.10

## 2019-12-23 HISTORY — DX: Unspecified asthma, uncomplicated: J45.909

## 2019-12-23 LAB — BASIC METABOLIC PANEL
Anion gap: 8 (ref 5–15)
BUN: 21 mg/dL (ref 8–23)
CO2: 22 mmol/L (ref 22–32)
Calcium: 9.2 mg/dL (ref 8.9–10.3)
Chloride: 104 mmol/L (ref 98–111)
Creatinine, Ser: 1.63 mg/dL — ABNORMAL HIGH (ref 0.61–1.24)
GFR calc Af Amer: 44 mL/min — ABNORMAL LOW (ref >60)
GFR calc non Af Amer: 38 mL/min — ABNORMAL LOW (ref >60)
Glucose, Bld: 123 mg/dL — ABNORMAL HIGH (ref 70–99)
Potassium: 3.9 mmol/L (ref 3.5–5.1)
Sodium: 134 mmol/L — ABNORMAL LOW (ref 135–145)

## 2019-12-23 LAB — CBC
HCT: 42.2 % (ref 39.0–52.0)
Hemoglobin: 13.6 g/dL (ref 13.0–17.0)
MCH: 29.3 pg (ref 26.0–34.0)
MCHC: 32.2 g/dL (ref 30.0–36.0)
MCV: 90.9 fL (ref 80.0–100.0)
Platelets: 254 10*3/uL (ref 150–400)
RBC: 4.64 MIL/uL (ref 4.22–5.81)
RDW: 14.5 % (ref 11.5–15.5)
WBC: 8 10*3/uL (ref 4.0–10.5)
nRBC: 0 % (ref 0.0–0.2)

## 2019-12-23 LAB — TYPE AND SCREEN
ABO/RH(D): O POS
Antibody Screen: NEGATIVE

## 2019-12-23 LAB — SARS CORONAVIRUS 2 (TAT 6-24 HRS): SARS Coronavirus 2: NEGATIVE

## 2019-12-23 LAB — SURGICAL PCR SCREEN
MRSA, PCR: NEGATIVE
Staphylococcus aureus: NEGATIVE

## 2019-12-23 NOTE — Progress Notes (Addendum)
PCP - Dr. Royetta Asal [facility] in [facility] and Dr. Jonnie Kind @ [facility] Cardiologist - Estevan Ryder PA Urologist: DR. Felipa Eth @ [facility] PPM/ICD - na   Chest x-ray - na EKG - 08/14/19 Stress Test - yrs. ago ECHO - 11/15/19 Cardiac Cath - > 15 yrs  Sleep Study - na   Fasting Blood Sugar - na   Blood Thinner Instructions: na Aspirin Instructions: stopped 1 week ago  ERAS Protcol -na   COVID TEST- 12/23/19   Anesthesia review:  Ekg. Previous surgery her ! [facility] 07/2119 Patient denies shortness of breath, fever, cough and chest pain at PAT appointment   All instructions explained to the patient, with a verbal understanding of the material. Patient agrees to go over the instructions while at home for a better understanding. Patient also instructed to self quarantine after being tested for COVID-19. The opportunity to ask questions was provided.

## 2019-12-24 NOTE — Progress Notes (Signed)
Anesthesia Chart Review:  Follow with cardiology at [facility] for CAD s/p stents to RCA in 2003 and 2005, HTN, HLD. He was last seen 11/01/19 and was doing well at that time. Per note, "He remains stable at this time with no chest pain or other symptoms concerning for angina. He continues to take aspirin, atorvastatin, losartan, and metoprolol." Updated echo was ordered which showed EF 55-60%, normal wall motion, normal valves.   History of renal cell carcinoma s/p L nephrectomy and CKDIII. Currently being followed by nephrology. Creatine is stable 1.4-1.6.   Pt was recently cleared by PCP and cardiology as moderate risk for lumbar surgery, which he underwent 11/09/22 without complication .   Preop labs reviewed, creatinine 1.63 which is near his baseline. Labs otherwise unremarkable.   EKG 08/14/19: Normal sinus rhythm. Rate 88. Inferior infarct , age undetermined.  TTE 11/15/19: SUMMARY  The left ventricular size is normal.  There is normal left ventricular wall thickness.  Left ventricular systolic function is normal.  LV ejection fraction = 55-60%.  The left ventricular wall motion is normal.  The right ventricle is normal in size and function.  There is no significant valvular stenosis or regurgitation.  There is no pericardial effusion.  Left ventricular filling pattern is prolonged relaxation.  The IVC is normal in size with an inspiratory collapse of greater then  50%, suggesting normal right atrial pressure.  There is no comparison study available.     Wynonia Musty [facility] Stay [facility]/Anesthesiology Phone [phone] 12/24/2019 11:11 AM

## 2019-12-24 NOTE — Anesthesia Preprocedure Evaluation (Addendum)
Anesthesia Evaluation  Patient identified by MRN, date of birth, ID band Patient awake    Reviewed: Allergy & Precautions, NPO status , Patient's Chart, lab work & pertinent test results, reviewed documented beta blocker date and time   Airway Mallampati: IV  TM Distance: >3 FB Neck ROM: Full    Dental  (+) Dental Advisory Given, Partial Upper   Pulmonary asthma , former smoker,    Pulmonary exam normal breath sounds clear to auscultation       Cardiovascular hypertension, Pt. on medications and Pt. on home beta blockers + CAD, + Past MI and + Cardiac Stents (RCA)  Normal cardiovascular exam Rhythm:Regular Rate:Normal     Neuro/Psych CERVICAL SPONDYLOSIS WITH MYELOPATHY negative psych ROS   GI/Hepatic negative GI ROS, Neg liver ROS,   Endo/Other  Obesity   Renal/GU Renal InsufficiencyRenal diseaserenal cell carcinoma s/p L nephrectomy     Musculoskeletal  (+) Arthritis ,   Abdominal   Peds  Hematology negative hematology ROS (+)   Anesthesia Other Findings   Reproductive/Obstetrics                           Anesthesia Physical Anesthesia Plan  ASA: III  Anesthesia Plan: General   Post-op Pain Management:    Induction: Intravenous  PONV Risk Score and Plan: 2 and Dexamethasone and Ondansetron  Airway Management Planned: Oral ETT and Video Laryngoscope Planned  Additional Equipment:   Intra-op Plan:   Post-operative Plan: Extubation in OR  Informed Consent: I have reviewed the patients History and Physical, chart, labs and discussed the procedure including the risks, benefits and alternatives for the proposed anesthesia with the patient or authorized representative who has indicated his/her understanding and acceptance.     Dental advisory given  Plan Discussed with: CRNA  Anesthesia Plan Comments: (2nd PIV!!!  PAT note by Karoline Caldwell, PA-C: Follow with cardiology at  [facility] for CAD s/p stents to RCA in 2003 and 2005, HTN, HLD. He was last seen 11/01/19 and was doing well at that time. Per note, "He remains stable at this time with no chest pain or other symptoms concerning for angina. He continues to take aspirin, atorvastatin, losartan, and metoprolol." Updated echo was ordered which showed EF 55-60%, normal wall motion, normal valves.   History of renal cell carcinoma s/p L nephrectomy and CKDIII. Currently being followed by nephrology. Creatine is stable 1.4-1.6.   Pt was recently cleared by PCP and cardiology as moderate risk for lumbar surgery, which he underwent 2/29/79 without complication .   Preop labs reviewed, creatinine 1.63 which is near his baseline. Labs otherwise unremarkable.   EKG 08/14/19: Normal sinus rhythm. Rate 88. Inferior infarct , age undetermined.  TTE 11/15/19: SUMMARY  The left ventricular size is normal.  There is normal left ventricular wall thickness.  Left ventricular systolic function is normal.  LV ejection fraction = 55-60%.  The left ventricular wall motion is normal.  The right ventricle is normal in size and function.  There is no significant valvular stenosis or regurgitation.  There is no pericardial effusion.  Left ventricular filling pattern is prolonged relaxation.  The IVC is normal in size with an inspiratory collapse of greater then  50%, suggesting normal right atrial pressure.  There is no comparison study available.   )      Anesthesia Quick Evaluation

## 2019-12-25 ENCOUNTER — Encounter (HOSPITAL_COMMUNITY)
Admission: RE | Disposition: A | Discharge status: Home / Self Care | Payer: Self-pay | Source: Home / Self Care | Attending: Neurosurgery

## 2019-12-25 ENCOUNTER — Inpatient Hospital Stay (HOSPITAL_COMMUNITY): Payer: No Typology Code available for payment source | Admitting: Vascular Surgery

## 2019-12-25 ENCOUNTER — Other Ambulatory Visit: Payer: Self-pay

## 2019-12-25 ENCOUNTER — Observation Stay (HOSPITAL_COMMUNITY)
Admission: RE | Admit: 2019-12-25 | Discharge: 2019-12-26 | Disposition: A | Discharge status: Home / Self Care | Payer: No Typology Code available for payment source | Attending: Neurosurgery | Admitting: Neurosurgery

## 2019-12-25 ENCOUNTER — Encounter (HOSPITAL_COMMUNITY): Payer: Self-pay

## 2019-12-25 ENCOUNTER — Inpatient Hospital Stay (HOSPITAL_COMMUNITY): Payer: No Typology Code available for payment source

## 2019-12-25 DIAGNOSIS — Z79899 Other long term (current) drug therapy: Secondary | ICD-10-CM | POA: Insufficient documentation

## 2019-12-25 DIAGNOSIS — Z87891 Personal history of nicotine dependence: Secondary | ICD-10-CM | POA: Insufficient documentation

## 2019-12-25 DIAGNOSIS — M4712 Other spondylosis with myelopathy, cervical region: Principal | ICD-10-CM | POA: Insufficient documentation

## 2019-12-25 DIAGNOSIS — C649 Malignant neoplasm of unspecified kidney, except renal pelvis: Secondary | ICD-10-CM | POA: Diagnosis not present

## 2019-12-25 DIAGNOSIS — M4312 Spondylolisthesis, cervical region: Secondary | ICD-10-CM | POA: Diagnosis not present

## 2019-12-25 DIAGNOSIS — N189 Chronic kidney disease, unspecified: Secondary | ICD-10-CM | POA: Insufficient documentation

## 2019-12-25 DIAGNOSIS — J45909 Unspecified asthma, uncomplicated: Secondary | ICD-10-CM | POA: Insufficient documentation

## 2019-12-25 DIAGNOSIS — I129 Hypertensive chronic kidney disease with stage 1 through stage 4 chronic kidney disease, or unspecified chronic kidney disease: Secondary | ICD-10-CM | POA: Diagnosis not present

## 2019-12-25 DIAGNOSIS — Z8546 Personal history of malignant neoplasm of prostate: Secondary | ICD-10-CM | POA: Insufficient documentation

## 2019-12-25 DIAGNOSIS — I119 Hypertensive heart disease without heart failure: Secondary | ICD-10-CM | POA: Diagnosis not present

## 2019-12-25 DIAGNOSIS — Z7982 Long term (current) use of aspirin: Secondary | ICD-10-CM | POA: Insufficient documentation

## 2019-12-25 DIAGNOSIS — Z419 Encounter for procedure for purposes other than remedying health state, unspecified: Secondary | ICD-10-CM

## 2019-12-25 DIAGNOSIS — G992 Myelopathy in diseases classified elsewhere: Secondary | ICD-10-CM | POA: Diagnosis present

## 2019-12-25 DIAGNOSIS — M4802 Spinal stenosis, cervical region: Secondary | ICD-10-CM | POA: Diagnosis present

## 2019-12-25 HISTORY — PX: POSTERIOR CERVICAL FUSION/FORAMINOTOMY: SHX5038

## 2019-12-25 LAB — ABO/RH: ABO/RH(D): O POS

## 2019-12-25 SURGERY — POSTERIOR CERVICAL FUSION/FORAMINOTOMY LEVEL 1
Anesthesia: General

## 2019-12-25 MED ORDER — PHENYLEPHRINE 40 MCG/ML (10ML) SYRINGE FOR IV PUSH (FOR BLOOD PRESSURE SUPPORT)
PREFILLED_SYRINGE | INTRAVENOUS | Status: AC
  Filled 2019-12-25: qty 10

## 2019-12-25 MED ORDER — DOCUSATE SODIUM 100 MG PO CAPS
100.0000 mg | ORAL_CAPSULE | Freq: Two times a day (BID) | ORAL | Status: DC
  Administered 2019-12-25 – 2019-12-26 (×2): 100 mg via ORAL
  Filled 2019-12-25 (×2): qty 1

## 2019-12-25 MED ORDER — LOSARTAN POTASSIUM 50 MG PO TABS
100.0000 mg | ORAL_TABLET | Freq: Every day | ORAL | Status: DC
  Administered 2019-12-26: 100 mg via ORAL
  Filled 2019-12-25: qty 2

## 2019-12-25 MED ORDER — EPHEDRINE 5 MG/ML INJ
INTRAVENOUS | Status: AC
  Filled 2019-12-25: qty 30

## 2019-12-25 MED ORDER — FENTANYL CITRATE (PF) 250 MCG/5ML IJ SOLN
INTRAMUSCULAR | Status: AC
  Filled 2019-12-25: qty 5

## 2019-12-25 MED ORDER — LACTATED RINGERS IV SOLN: INTRAVENOUS | Status: DC

## 2019-12-25 MED ORDER — LIDOCAINE 2% (20 MG/ML) 5 ML SYRINGE
INTRAMUSCULAR | Status: AC
  Filled 2019-12-25: qty 5

## 2019-12-25 MED ORDER — POLYETHYLENE GLYCOL 3350 17 G PO PACK: 17.0000 g | PACK | Freq: Every day | ORAL | Status: DC | PRN

## 2019-12-25 MED ORDER — ACETAMINOPHEN 500 MG PO TABS
ORAL_TABLET | ORAL | Status: AC
  Administered 2019-12-25: 1000 mg via ORAL
  Filled 2019-12-25: qty 2

## 2019-12-25 MED ORDER — ONDANSETRON HCL 4 MG/2ML IJ SOLN
INTRAMUSCULAR | Status: AC
  Filled 2019-12-25: qty 2

## 2019-12-25 MED ORDER — ENOXAPARIN SODIUM 40 MG/0.4ML ~~LOC~~ SOLN
40.0000 mg | SUBCUTANEOUS | Status: DC
  Administered 2019-12-26: 40 mg via SUBCUTANEOUS
  Filled 2019-12-25: qty 0.4

## 2019-12-25 MED ORDER — SUCCINYLCHOLINE CHLORIDE 200 MG/10ML IV SOSY
PREFILLED_SYRINGE | INTRAVENOUS | Status: AC
  Filled 2019-12-25: qty 10

## 2019-12-25 MED ORDER — 0.9 % SODIUM CHLORIDE (POUR BTL) OPTIME
TOPICAL | Status: DC | PRN
  Administered 2019-12-25: 1000 mL

## 2019-12-25 MED ORDER — ROCURONIUM BROMIDE 10 MG/ML (PF) SYRINGE
PREFILLED_SYRINGE | INTRAVENOUS | Status: DC | PRN
  Administered 2019-12-25: 10 mg via INTRAVENOUS
  Administered 2019-12-25: 90 mg via INTRAVENOUS
  Administered 2019-12-25: 30 mg via INTRAVENOUS

## 2019-12-25 MED ORDER — SODIUM CHLORIDE 0.9 % IV SOLN
INTRAVENOUS | Status: DC | PRN
  Administered 2019-12-25: 500 mL

## 2019-12-25 MED ORDER — DEXAMETHASONE SODIUM PHOSPHATE 10 MG/ML IJ SOLN
INTRAMUSCULAR | Status: AC
  Filled 2019-12-25: qty 2

## 2019-12-25 MED ORDER — POTASSIUM CHLORIDE IN NACL 20-0.9 MEQ/L-% IV SOLN: INTRAVENOUS | Status: DC

## 2019-12-25 MED ORDER — ROCURONIUM BROMIDE 10 MG/ML (PF) SYRINGE
PREFILLED_SYRINGE | INTRAVENOUS | Status: AC
  Filled 2019-12-25: qty 10

## 2019-12-25 MED ORDER — ACETAMINOPHEN 325 MG PO TABS: 650.0000 mg | ORAL_TABLET | ORAL | Status: DC | PRN

## 2019-12-25 MED ORDER — FENTANYL CITRATE (PF) 100 MCG/2ML IJ SOLN: 25.0000 ug | INTRAMUSCULAR | Status: DC | PRN

## 2019-12-25 MED ORDER — BACITRACIN ZINC 500 UNIT/GM EX OINT
TOPICAL_OINTMENT | CUTANEOUS | Status: DC | PRN
  Administered 2019-12-25: 1 via TOPICAL

## 2019-12-25 MED ORDER — ONDANSETRON HCL 4 MG/2ML IJ SOLN
INTRAMUSCULAR | Status: DC | PRN
  Administered 2019-12-25: 4 mg via INTRAVENOUS

## 2019-12-25 MED ORDER — SODIUM CHLORIDE 0.9% FLUSH
3.0000 mL | Freq: Two times a day (BID) | INTRAVENOUS | Status: DC
  Administered 2019-12-25 – 2019-12-26 (×3): 3 mL via INTRAVENOUS

## 2019-12-25 MED ORDER — CEFAZOLIN SODIUM 1 G IJ SOLR
INTRAMUSCULAR | Status: AC
  Filled 2019-12-25: qty 20

## 2019-12-25 MED ORDER — SODIUM CHLORIDE 0.9 % IV SOLN
250.0000 mL | INTRAVENOUS | Status: DC
  Administered 2019-12-25: 250 mL via INTRAVENOUS

## 2019-12-25 MED ORDER — ONDANSETRON HCL 4 MG PO TABS: 4.0000 mg | ORAL_TABLET | Freq: Four times a day (QID) | ORAL | Status: DC | PRN

## 2019-12-25 MED ORDER — DEXAMETHASONE SODIUM PHOSPHATE 10 MG/ML IJ SOLN
INTRAMUSCULAR | Status: AC
  Filled 2019-12-25: qty 1

## 2019-12-25 MED ORDER — SUGAMMADEX SODIUM 200 MG/2ML IV SOLN
INTRAVENOUS | Status: DC | PRN
  Administered 2019-12-25: 200 mg via INTRAVENOUS

## 2019-12-25 MED ORDER — OXYCODONE HCL 5 MG PO TABS
5.0000 mg | ORAL_TABLET | ORAL | Status: DC | PRN
  Administered 2019-12-25: 10 mg via ORAL
  Administered 2019-12-25: 5 mg via ORAL
  Administered 2019-12-26: 10 mg via ORAL
  Administered 2019-12-26: 5 mg via ORAL
  Filled 2019-12-25: qty 2
  Filled 2019-12-25 (×2): qty 1
  Filled 2019-12-25: qty 2

## 2019-12-25 MED ORDER — THROMBIN 5000 UNITS EX SOLR
OROMUCOSAL | Status: DC | PRN
  Administered 2019-12-25: 5 mL via TOPICAL

## 2019-12-25 MED ORDER — ALBUTEROL SULFATE (2.5 MG/3ML) 0.083% IN NEBU: 3.0000 mL | INHALATION_SOLUTION | Freq: Four times a day (QID) | RESPIRATORY_TRACT | Status: DC | PRN

## 2019-12-25 MED ORDER — ONDANSETRON HCL 4 MG/2ML IJ SOLN: 4.0000 mg | Freq: Once | INTRAMUSCULAR | Status: DC | PRN

## 2019-12-25 MED ORDER — CHLORHEXIDINE GLUCONATE 0.12 % MT SOLN: 15.0000 mL | Freq: Once | OROMUCOSAL | Status: AC

## 2019-12-25 MED ORDER — LIDOCAINE-EPINEPHRINE 1 %-1:100000 IJ SOLN
INTRAMUSCULAR | Status: DC | PRN
  Administered 2019-12-25: 9 mL

## 2019-12-25 MED ORDER — ONDANSETRON HCL 4 MG/2ML IJ SOLN
INTRAMUSCULAR | Status: AC
  Filled 2019-12-25: qty 4

## 2019-12-25 MED ORDER — EPHEDRINE SULFATE-NACL 50-0.9 MG/10ML-% IV SOSY
PREFILLED_SYRINGE | INTRAVENOUS | Status: DC | PRN
  Administered 2019-12-25: 10 mg via INTRAVENOUS

## 2019-12-25 MED ORDER — VITAMIN D 25 MCG (1000 UNIT) PO TABS
1000.0000 [IU] | ORAL_TABLET | Freq: Every day | ORAL | Status: DC
  Administered 2019-12-26: 1000 [IU] via ORAL
  Filled 2019-12-25 (×2): qty 1

## 2019-12-25 MED ORDER — FLEET ENEMA 7-19 GM/118ML RE ENEM: 1.0000 | ENEMA | Freq: Once | RECTAL | Status: DC | PRN

## 2019-12-25 MED ORDER — LATANOPROST 0.005 % OP SOLN
1.0000 [drp] | Freq: Every day | OPHTHALMIC | Status: DC
  Filled 2019-12-25: qty 2.5

## 2019-12-25 MED ORDER — LACTATED RINGERS IV SOLN
INTRAVENOUS | Status: DC | PRN
Start: 2019-12-25 — End: 2019-12-25

## 2019-12-25 MED ORDER — BUPIVACAINE HCL (PF) 0.5 % IJ SOLN
INTRAMUSCULAR | Status: AC
  Filled 2019-12-25: qty 30

## 2019-12-25 MED ORDER — CHLORHEXIDINE GLUCONATE 0.12 % MT SOLN
OROMUCOSAL | Status: AC
  Administered 2019-12-25: 15 mL via OROMUCOSAL
  Filled 2019-12-25: qty 15

## 2019-12-25 MED ORDER — THROMBIN 5000 UNITS EX SOLR
CUTANEOUS | Status: DC | PRN
  Administered 2019-12-25: 10000 [IU] via TOPICAL

## 2019-12-25 MED ORDER — ONDANSETRON HCL 4 MG/2ML IJ SOLN: 4.0000 mg | Freq: Four times a day (QID) | INTRAMUSCULAR | Status: DC | PRN

## 2019-12-25 MED ORDER — BACITRACIN ZINC 500 UNIT/GM EX OINT
TOPICAL_OINTMENT | CUTANEOUS | Status: AC
  Filled 2019-12-25: qty 28.35

## 2019-12-25 MED ORDER — ACETAMINOPHEN 500 MG PO TABS: 1000.0000 mg | ORAL_TABLET | Freq: Once | ORAL | Status: AC

## 2019-12-25 MED ORDER — METOPROLOL TARTRATE 25 MG PO TABS
25.0000 mg | ORAL_TABLET | Freq: Every day | ORAL | Status: DC
  Administered 2019-12-26: 25 mg via ORAL
  Filled 2019-12-25: qty 1

## 2019-12-25 MED ORDER — ACETAMINOPHEN 650 MG RE SUPP: 650.0000 mg | RECTAL | Status: DC | PRN

## 2019-12-25 MED ORDER — THROMBIN 5000 UNITS EX SOLR
CUTANEOUS | Status: AC
  Filled 2019-12-25: qty 15000

## 2019-12-25 MED ORDER — PROPOFOL 10 MG/ML IV BOLUS
INTRAVENOUS | Status: AC
  Filled 2019-12-25: qty 40

## 2019-12-25 MED ORDER — LIDOCAINE 2% (20 MG/ML) 5 ML SYRINGE
INTRAMUSCULAR | Status: DC | PRN
  Administered 2019-12-25: 40 mg via INTRAVENOUS

## 2019-12-25 MED ORDER — ATORVASTATIN CALCIUM 40 MG PO TABS
40.0000 mg | ORAL_TABLET | Freq: Every day | ORAL | Status: DC
  Administered 2019-12-26: 40 mg via ORAL
  Filled 2019-12-25: qty 1

## 2019-12-25 MED ORDER — AMLODIPINE BESYLATE 5 MG PO TABS
5.0000 mg | ORAL_TABLET | Freq: Every day | ORAL | Status: DC
  Administered 2019-12-26: 5 mg via ORAL
  Filled 2019-12-25: qty 1

## 2019-12-25 MED ORDER — CYCLOBENZAPRINE HCL 10 MG PO TABS
10.0000 mg | ORAL_TABLET | Freq: Three times a day (TID) | ORAL | Status: DC | PRN
  Administered 2019-12-25 – 2019-12-26 (×2): 10 mg via ORAL
  Filled 2019-12-25 (×2): qty 1

## 2019-12-25 MED ORDER — HYDROMORPHONE HCL 1 MG/ML IJ SOLN
0.5000 mg | INTRAMUSCULAR | Status: DC | PRN
  Administered 2019-12-25: 0.5 mg via INTRAVENOUS
  Filled 2019-12-25: qty 0.5

## 2019-12-25 MED ORDER — PHENOL 1.4 % MT LIQD: 1.0000 | OROMUCOSAL | Status: DC | PRN

## 2019-12-25 MED ORDER — PHENYLEPHRINE HCL-NACL 10-0.9 MG/250ML-% IV SOLN
INTRAVENOUS | Status: DC | PRN
Start: 2019-12-25 — End: 2019-12-25
  Administered 2019-12-25: 60 ug/min via INTRAVENOUS
  Administered 2019-12-25: 25 ug/min via INTRAVENOUS

## 2019-12-25 MED ORDER — LIDOCAINE-EPINEPHRINE 2 %-1:100000 IJ SOLN
INTRAMUSCULAR | Status: AC
  Filled 2019-12-25: qty 1

## 2019-12-25 MED ORDER — CEFAZOLIN SODIUM-DEXTROSE 2-3 GM-%(50ML) IV SOLR
INTRAVENOUS | Status: DC | PRN
Start: 2019-12-25 — End: 2019-12-25
  Administered 2019-12-25: 2 g via INTRAVENOUS

## 2019-12-25 MED ORDER — PROPOFOL 10 MG/ML IV BOLUS
INTRAVENOUS | Status: DC | PRN
  Administered 2019-12-25: 70 mg via INTRAVENOUS
  Administered 2019-12-25: 140 mg via INTRAVENOUS
  Administered 2019-12-25: 60 mg via INTRAVENOUS

## 2019-12-25 MED ORDER — DEXAMETHASONE SODIUM PHOSPHATE 10 MG/ML IJ SOLN
INTRAMUSCULAR | Status: DC | PRN
Start: 2019-12-25 — End: 2019-12-25
  Administered 2019-12-25: 10 mg via INTRAVENOUS

## 2019-12-25 MED ORDER — PANTOPRAZOLE SODIUM 40 MG PO TBEC
40.0000 mg | DELAYED_RELEASE_TABLET | Freq: Every day | ORAL | Status: DC
  Administered 2019-12-26: 40 mg via ORAL
  Filled 2019-12-25: qty 1

## 2019-12-25 MED ORDER — HEMOSTATIC AGENTS (NO CHARGE) OPTIME
TOPICAL | Status: DC | PRN
  Administered 2019-12-25: 1 via TOPICAL

## 2019-12-25 MED ORDER — LIDOCAINE-EPINEPHRINE 1 %-1:100000 IJ SOLN
INTRAMUSCULAR | Status: AC
  Filled 2019-12-25: qty 1

## 2019-12-25 MED ORDER — ORAL CARE MOUTH RINSE: 15.0000 mL | Freq: Once | OROMUCOSAL | Status: AC

## 2019-12-25 MED ORDER — SODIUM CHLORIDE 0.9% FLUSH: 3.0000 mL | INTRAVENOUS | Status: DC | PRN

## 2019-12-25 MED ORDER — FENTANYL CITRATE (PF) 250 MCG/5ML IJ SOLN
INTRAMUSCULAR | Status: DC | PRN
  Administered 2019-12-25 (×2): 50 ug via INTRAVENOUS
  Administered 2019-12-25: 100 ug via INTRAVENOUS
  Administered 2019-12-25 (×2): 50 ug via INTRAVENOUS

## 2019-12-25 MED ORDER — PHENYLEPHRINE 40 MCG/ML (10ML) SYRINGE FOR IV PUSH (FOR BLOOD PRESSURE SUPPORT)
PREFILLED_SYRINGE | INTRAVENOUS | Status: DC | PRN
  Administered 2019-12-25: 120 ug via INTRAVENOUS
  Administered 2019-12-25: 80 ug via INTRAVENOUS

## 2019-12-25 MED ORDER — MENTHOL 3 MG MT LOZG: 1.0000 | LOZENGE | OROMUCOSAL | Status: DC | PRN

## 2019-12-25 MED ORDER — CEFAZOLIN SODIUM-DEXTROSE 2-4 GM/100ML-% IV SOLN
2.0000 g | Freq: Three times a day (TID) | INTRAVENOUS | Status: AC
  Administered 2019-12-25 – 2019-12-26 (×2): 2 g via INTRAVENOUS
  Filled 2019-12-25 (×2): qty 100

## 2019-12-25 SURGICAL SUPPLY — 65 items
BAG DECANTER FOR FLEXI CONT (MISCELLANEOUS) ×3 IMPLANT
BAND RUBBER #18 3X1/16 STRL (MISCELLANEOUS) IMPLANT
BENZOIN TINCTURE PRP APPL 2/3 (GAUZE/BANDAGES/DRESSINGS) ×3 IMPLANT
BIT DRILL NEURO 2X3.1 SFT TUCH (MISCELLANEOUS) ×1 IMPLANT
BIT DRILL WIRE PASS 1.3MM (BIT) IMPLANT
BLADE CLIPPER SURG (BLADE) IMPLANT
BUR MATCHSTICK NEURO 3.0 LAGG (BURR) ×3 IMPLANT
CANISTER SUCT 3000ML PPV (MISCELLANEOUS) ×3 IMPLANT
CLOSURE WOUND 1/2 X4 (GAUZE/BANDAGES/DRESSINGS) ×1
COVER WAND RF STERILE (DRAPES) ×3 IMPLANT
DECANTER SPIKE VIAL GLASS SM (MISCELLANEOUS) ×3 IMPLANT
DRAIN CHANNEL 10M FLAT 3/4 FLT (DRAIN) ×3 IMPLANT
DRAPE C-ARM 42X72 X-RAY (DRAPES) ×6 IMPLANT
DRAPE LAPAROTOMY 100X72 PEDS (DRAPES) ×3 IMPLANT
DRAPE MICROSCOPE LEICA (MISCELLANEOUS) IMPLANT
DRAPE SURG 17X23 STRL (DRAPES) ×3 IMPLANT
DRILL NEURO 2X3.1 SOFT TOUCH (MISCELLANEOUS) ×3
DRILL WIRE PASS 1.3MM (BIT)
DRSG OPSITE POSTOP 4X6 (GAUZE/BANDAGES/DRESSINGS) ×3 IMPLANT
DURAPREP 26ML APPLICATOR (WOUND CARE) ×3 IMPLANT
ELECT COATED BLADE 2.86 ST (ELECTRODE) ×3 IMPLANT
ELECT REM PT RETURN 9FT ADLT (ELECTROSURGICAL) ×3
ELECTRODE REM PT RTRN 9FT ADLT (ELECTROSURGICAL) ×1 IMPLANT
EVACUATOR SILICONE 100CC (DRAIN) ×3 IMPLANT
GAUZE 4X4 16PLY RFD (DISPOSABLE) IMPLANT
GLOVE BIOGEL PI IND STRL 6.5 (GLOVE) ×1 IMPLANT
GLOVE BIOGEL PI IND STRL 7.5 (GLOVE) ×2 IMPLANT
GLOVE BIOGEL PI INDICATOR 6.5 (GLOVE) ×2
GLOVE BIOGEL PI INDICATOR 7.5 (GLOVE) ×4
GLOVE ECLIPSE 7.5 STRL STRAW (GLOVE) ×3 IMPLANT
GLOVE EXAM NITRILE XL STR (GLOVE) IMPLANT
GLOVE SURG SS PI 6.5 STRL IVOR (GLOVE) ×3 IMPLANT
GOWN STRL REUS W/ TWL LRG LVL3 (GOWN DISPOSABLE) ×3 IMPLANT
GOWN STRL REUS W/ TWL XL LVL3 (GOWN DISPOSABLE) ×1 IMPLANT
GOWN STRL REUS W/TWL 2XL LVL3 (GOWN DISPOSABLE) IMPLANT
GOWN STRL REUS W/TWL LRG LVL3 (GOWN DISPOSABLE) ×6
GOWN STRL REUS W/TWL XL LVL3 (GOWN DISPOSABLE) ×2
HEMOSTAT POWDER KIT SURGIFOAM (HEMOSTASIS) ×3 IMPLANT
KIT BASIN OR (CUSTOM PROCEDURE TRAY) ×3 IMPLANT
KIT TURNOVER KIT B (KITS) ×3 IMPLANT
NEEDLE HYPO 22GX1.5 SAFETY (NEEDLE) ×3 IMPLANT
NEEDLE SPNL 18GX3.5 QUINCKE PK (NEEDLE) IMPLANT
NS IRRIG 1000ML POUR BTL (IV SOLUTION) ×3 IMPLANT
PACK LAMINECTOMY NEURO (CUSTOM PROCEDURE TRAY) ×3 IMPLANT
PAD ARMBOARD 7.5X6 YLW CONV (MISCELLANEOUS) ×9 IMPLANT
PATTIES SURGICAL .5 X3 (DISPOSABLE) ×3 IMPLANT
PUTTY BONE DBX 5CC MIX (Putty) ×3 IMPLANT
ROD LORD SYMP 4X30 (Rod) ×3 IMPLANT
ROD LORD SYMP 4X35 (Rod) ×3 IMPLANT
SCREW POLY SYMP 4X20 (Screw) ×6 IMPLANT
SCREW POLY SYMP 5X26 (Screw) ×6 IMPLANT
SCREW SET SPINAL (Screw) ×12 IMPLANT
SPONGE LAP 4X18 RFD (DISPOSABLE) IMPLANT
SPONGE SURGIFOAM ABS GEL SZ50 (HEMOSTASIS) IMPLANT
STRIP CLOSURE SKIN 1/2X4 (GAUZE/BANDAGES/DRESSINGS) ×2 IMPLANT
SUT MNCRL AB 4-0 PS2 18 (SUTURE) ×3 IMPLANT
SUT VIC AB 0 CT1 18XCR BRD8 (SUTURE) ×2 IMPLANT
SUT VIC AB 0 CT1 8-18 (SUTURE) ×4
SUT VIC AB 2-0 CP2 18 (SUTURE) ×6 IMPLANT
SYR 3ML LL SCALE MARK (SYRINGE) IMPLANT
TAP THREADED CFX 5.0 (ORTHOPEDIC DISPOSABLE SUPPLIES) ×3 IMPLANT
TAP THREADED STD 4.0 (ORTHOPEDIC DISPOSABLE SUPPLIES) ×3 IMPLANT
TOWEL GREEN STERILE (TOWEL DISPOSABLE) ×3 IMPLANT
TOWEL GREEN STERILE FF (TOWEL DISPOSABLE) ×3 IMPLANT
WATER STERILE IRR 1000ML POUR (IV SOLUTION) ×3 IMPLANT

## 2019-12-25 NOTE — Transfer of Care (Signed)
Immediate Anesthesia Transfer of Care Note  Patient: Craig Werner  Procedure(s) Performed: POSTERIOR CERVICAL DECOMPRESSION AND FUSION CERVICAL 7- THORACIC 1 (N/A )  Patient Location: PACU  Anesthesia Type:General  Level of Consciousness: awake, drowsy and patient cooperative  Airway & Oxygen Therapy: Patient Spontanous Breathing  Post-op Assessment: Report given to RN and Post -op Vital signs reviewed and stable  Post vital signs: Reviewed and stable  Last Vitals:  Vitals Value Taken Time  BP 135/68 12/25/19 1156  Temp    Pulse 91 12/25/19 1158  Resp 15 12/25/19 1158  SpO2 90 % 12/25/19 1158  Vitals shown include unvalidated device data.  Last Pain:  Vitals:   12/25/19 0707  PainSc: 0-No pain      Patients Stated Pain Goal: 3 (32/99/24 2683)  Complications: No complications documented.

## 2019-12-25 NOTE — Progress Notes (Signed)
Orthopedic Tech Progress Note Patient Details:  QUAID YEAKLE 05-03-1934 [id number] Called in order to [facility] for a CTSO Patient ID: Meriel Pica, male   DOB: 11/13/1933, 86 y.o.   MRN: [id number]   Janit Pagan 12/25/2019, 1:06 PM

## 2019-12-25 NOTE — Progress Notes (Signed)
Neurosurgery Patient seen and examined. 5/5 strength and hand grip, finger abduction, and upper extremities. 4+/5 strength in lower extremities, effort dependent. No radicular pain. Has some soreness in back of neck.  - will plan for CTSO brace - f/u drain output - Possible dc tomorrow

## 2019-12-25 NOTE — Anesthesia Procedure Notes (Signed)
Procedure Name: Intubation Date/Time: 12/25/2019 8:48 AM Performed by: Janace Litten, CRNA Pre-anesthesia Checklist: Patient identified, Emergency Drugs available, Suction available and Patient being monitored Patient Re-evaluated:Patient Re-evaluated prior to induction Oxygen Delivery Method: Circle system utilized Preoxygenation: Pre-oxygenation with 100% oxygen Induction Type: IV induction Ventilation: Oral airway inserted - appropriate to patient size and Mask ventilation without difficulty Laryngoscope Size: Glidescope and 4 Grade View: Grade I Tube type: Oral Tube size: 7.5 mm Number of attempts: 1 Airway Equipment and Method: Stylet and Oral airway Placement Confirmation: ETT inserted through vocal cords under direct vision,  positive ETCO2 and breath sounds checked- equal and bilateral Secured at: 23 cm Tube secured with: Tape Dental Injury: Teeth and Oropharynx as per pre-operative assessment

## 2019-12-25 NOTE — Op Note (Signed)
Procedure(s): POSTERIOR CERVICAL DECOMPRESSION AND FUSION CERVICAL 7- THORACIC 1 Procedure Note  Craig Werner male 86 y.o. 12/25/2019  Procedure(s) and Anesthesia Type:    * POSTERIOR CERVICAL DECOMPRESSION AND FUSION CERVICAL 7- THORACIC 1 - General  Surgeon(s) and Role:    Marcello Moores, Dorcas Carrow, MD - Primary   Indications: [name] with a history of coronary artery disease who recently underwent posterior lumbar decompression for neurogenic claudication.  Postoperatively, he there was uncovering of myelopathic symptoms in his lower legs, with increased tonicity and spastic gait.  This prompted MRI of his cervical and thoracic spine to be performed, and showed severe cord impingement associated with C7-T1 spondylolisthesis, with cord signal change and myelomalacia.  Given his symptoms have been worsening and he was no longer able to ambulate well and with progressive functional decline, I recommended surgery.  Risks, benefits, alternatives, and expected convalescence were discussed.  Risks discussed included, but were not limited to, bleeding, pain, infection, scar, neurologic deficit, pseudoarthrosis, progressive degeneration of his spine, spinal fluid leak, and death.  Informed consent was obtained.     Surgeon: Vallarie Mare   Anesthesia: General endotracheal anesthesia   Procedure Detail  Patient was brought to the operating room.  After appropriate lines and monitors placed, patient was intubated by the anesthesia service.  Patient was flipped prone on gel rolls and the head was placed in a [place] head holder and affixed to the bed in neutral position.  His lower neck was preprepped with alcohol and prepped and draped in sterile fashion.  1% lidocaine with epinephrine was injected into the planned incision.  Preoperative antibiotics and dexamethasone were given.  A 10 blade was used to incise at the midline and the subcutaneous tissue and fascia were sharply  incised with monopolar electrocautery.  The paraspinous muscles were then dissected off of C7 and T1 in subperiosteal fashion.  X-ray confirmed localization using both AP and lateral x-rays.  On lateral x-ray, a Allis clamp affixed to the C6 spinous process was used to determine C7 and T1, and the levels were readily identifiable via AP.  Dissection continued out laterally and the transverse process of T1 was exposed bilaterally.  The interspinous ligament between C7-T1 was removed and the spinous processes of C7 and part of T1 were harvested for autograft.  The superiormost portion of the C7 lamina was kept intact.  Combination of high-speed drills and rongeurs were used to complete partial laminectomy of C7 and the superior portion of T1 as well as bilateral foraminotomies.  The foramina were opened up bilaterally to allow readily the identification of the C7 and T1 pedicles bilaterally.  Good decompression was confirmed with easy passage of nerve hook.  Using combination of C-arm x-ray and palpable landmarks, pedicle screws were placed at T1 bilaterally by drilling a pilot hole followed by cannulating pedicle with a pedicle finder.  Pedicle feeler was then used to confirm bony channels throughout the cannulation.  The screw hole was then tapped, palpated again with a pedicle feeler, and 5.0 by 25 mm screws were placed.  At C7, left C7 pedicle screw was placed in similar manner.  In right C7, during placement of the pedicle screw, there was a lateral breach apparent.  As such, a more medial entry point was selected.  Pedicle finder and tapped was performed which revealed good cannulation.  However with placement of the screw, the medial portion of the pedicle did fracture.  However, there was purchase  in the remaining lateral mass and the body and on direct inspection of the widely open foramen, the C8 nerve was clearly traversing inferior to the pedicle.  Kocher was used to test the pullout strength of the screw  which appeared to be strong.  As there did not appear to be any other good option to cannulate the C7 pedicle or lateral mass, and additional rescue options would require extension of fusion to superior levels, I decided to accept this suboptimal screw.  On the left side, the C7 pedicle was cannulated without difficulty and a 4.0 by 20 mm screw was placed.  C-arm x-ray confirmed good placement of the screws.  The right C7 screw, because of its medial entry point, was more straight in.  the T1 transverse process and remaining facets were decorticated at C7-T1 bilaterally.  Wound was irrigated thoroughly with bacitracin impregnated irrigation.  30 and 35 mm rods were placed in the tulip heads and final tightened.  Autograft mixed with DBX allograft was placed in the lateral gutters bilaterally.  A 7 flat JP was placed in the subfascial space and tunneled out the skin.  The muscle layer was closed with 0 Vicryl stitches.  The fascia was closed with 0 Vicryl stitches.  The dermal layer was closed with 2-0 Vicryl stitches in buried interrupted fashion.  The skin was closed with 4-0 Monocryl in subcuticular manner followed by benzoin Steri-Strips and a sterile dressing.  Patient was then removed from Mayfield head holder and flipped supine extubated by the anesthesia service following commands in all 4 extremities.  All counts were correct at the end of surgery.  No complications were noted.  POSTERIOR CERVICAL DECOMPRESSION AND FUSION CERVICAL 7- THORACIC 1  Findings: Successful decompression and arthrodesis  Estimated Blood Loss:  200 ml         Drains: JACKSON-PRATT (JP)         Blood Given: none         Specimens: none         Implants: Synthes/Depuy system with 4.0 x 20 mm C7 pedicle screws and 5.0 x 25 mm T1 pedicle screws, 68mm and 30 mm rods.        Complications:  * No complications entered in OR log *         Disposition: PACU - hemodynamically stable.         Condition: stable

## 2019-12-25 NOTE — Anesthesia Postprocedure Evaluation (Signed)
Anesthesia Post Note  Patient: [name]  Procedure(s) Performed: POSTERIOR CERVICAL DECOMPRESSION AND FUSION CERVICAL 7- THORACIC 1 (N/A )     Patient location during evaluation: PACU Anesthesia Type: General Level of consciousness: awake and alert, awake and oriented Pain management: pain level controlled Vital Signs Assessment: post-procedure vital signs reviewed and stable Respiratory status: spontaneous breathing, nonlabored ventilation, respiratory function stable and patient connected to nasal cannula oxygen Cardiovascular status: blood pressure returned to baseline and stable Postop Assessment: no apparent nausea or vomiting Anesthetic complications: no   No complications documented.  Last Vitals:  Vitals:   12/25/19 1312 12/25/19 1641  BP: (!) 114/57 (!) 171/91  Pulse: 83 77  Resp: 17 18  Temp: (!) 36.4 C 36.6 C  SpO2: 93% 94%    Last Pain:  Vitals:   12/25/19 1241  PainSc: 0-No pain                 Catalina Gravel

## 2019-12-25 NOTE — H&P (Signed)
Subjective:   Patient is a 86 y.o. male with hx of CAD recently underwent lumbar decompression for neurogenic claudication.  This uncovered more apparent myelopathy symptoms in his lower extremities, and an MRI showed spinal cord impingement with cord signal change and spondylolisthesis at C7-T1.    There are no problems to display for this patient.  Past Medical History:  Diagnosis Date  . Arthritis   . Asthma   . Cancer [facility])    prostate cancer; kidney cancer  . Chronic kidney disease    kidney cancer; Left kidney removed  . Coronary artery disease   . Hypertension   . Myocardial infarction ([place])    x 2   . Pneumonia     Past Surgical History:  Procedure Laterality Date  . APPENDECTOMY    . CARDIAC CATHETERIZATION     x 3 stents  . EYE SURGERY Right    right eye sx.  Marland Kitchen JOINT REPLACEMENT Right    3 joint replacements  . LUMBAR LAMINECTOMY/DECOMPRESSION MICRODISCECTOMY N/A 08/15/2019   Procedure: Lumbar four-five Laminectomy/Foraminotomy;  Surgeon: Vallarie Mare, MD;  Location: [facility];  Service: Neurosurgery;  Laterality: N/A;  . NEPHRECTOMY Left   . TONSILLECTOMY      Medications Prior to Admission  Medication Sig Dispense Refill Last Dose  . acetaminophen (TYLENOL) 500 MG tablet Take 1,000 mg by mouth every 8 (eight) hours as needed (for pain.).    12/24/2019 at Unknown time  . albuterol (VENTOLIN HFA) 108 (90 Base) MCG/ACT inhaler Inhale 1-2 puffs into the lungs every 6 (six) hours as needed for wheezing or shortness of breath.   Past Month at Unknown time  . amLODipine (NORVASC) 5 MG tablet Take 5 mg by mouth daily.   12/25/2019 at 0400  . aspirin EC 81 MG tablet Take 81 mg by mouth daily.   12/18/2019  . atorvastatin (LIPITOR) 40 MG tablet Take 40 mg by mouth daily.   12/25/2019 at 0400  . cholecalciferol (VITAMIN D) 25 MCG (1000 UNIT) tablet Take 1,000 Units by mouth daily.   12/25/2019 at 0400  . docusate sodium (COLACE) 100 MG capsule Take 100 mg by mouth 2 (two)  times daily as needed for constipation.   12/25/2019 at 0400  . losartan (COZAAR) 100 MG tablet Take 100 mg by mouth daily.   12/25/2019 at 0400  . metoprolol tartrate (LOPRESSOR) 25 MG tablet Take 25 mg by mouth daily.   12/25/2019 at 0400  . sildenafil (VIAGRA) 100 MG tablet Take 100 mg by mouth daily as needed.     . Travoprost, BAK Free, (TRAVATAN) 0.004 % SOLN ophthalmic solution Place 1 drop into both eyes at bedtime.   Past Week at Unknown time   Allergies  Allergen Reactions  . Lisinopril Cough  . Promethazine Other (See Comments)    Hallucinations    . Morphine Itching         Social History   Tobacco Use  . Smoking status: Former Research scientist (life sciences)  . Smokeless tobacco: Never Used  . Tobacco comment: quit 40 years ago  Substance Use Topics  . Alcohol use: Yes    Comment: 2-3 beers every other night    History reviewed. No pertinent family history.  Review of Systems Pertinent items are noted in HPI.  Objective:   Patient Vitals for the past 8 hrs:  BP Temp Pulse Resp SpO2 Height Weight  12/25/19 0606 (!) 129/58 98.3 F (36.8 C) 60 18 98 % 5\' 5"  (1.651 m) 82.1  kg   No intake/output data recorded. No intake/output data recorded.    NAD Appears stated age Breathing comfortably Abd soft Ext wwp 3+ patellar and Achilles reflexes Gait not tested, but in clinic had myelopathic wide based, slow gait with poor hip flexion and kee bend.  Data Review CBC:  Lab Results  Component Value Date   WBC 8.0 12/23/2019   RBC 4.64 12/23/2019   BMP:  Lab Results  Component Value Date   GLUCOSE 123 (H) 12/23/2019   CO2 22 12/23/2019   BUN 21 12/23/2019   CREATININE 1.63 (H) 12/23/2019   CALCIUM 9.2 12/23/2019   MRI was reviewed   Assessment:   86 yo with CAD, lumbar stenosis s/p decompression who has cervical myelopathy with severe cord impingement at C7-T1 with spondylolisthesis  Plan:   - plan for C7-T1 posterior decompression and fusion - I explained he had  advanced degenerative disease at other levels in his subaxial spine, but as this is his symptomatic level, we will focus on addressing this level in order to minimize the scope of surgery and its risks. - informed consent obtained

## 2019-12-26 ENCOUNTER — Encounter (HOSPITAL_COMMUNITY): Payer: Self-pay | Admitting: Neurosurgery

## 2019-12-26 DIAGNOSIS — M4712 Other spondylosis with myelopathy, cervical region: Secondary | ICD-10-CM | POA: Diagnosis not present

## 2019-12-26 MED ORDER — OXYCODONE-ACETAMINOPHEN 5-325 MG PO TABS: 1.0000 | ORAL_TABLET | ORAL | 0 refills | Status: AC | PRN

## 2019-12-26 MED ORDER — CYCLOBENZAPRINE HCL 10 MG PO TABS: 10.0000 mg | ORAL_TABLET | Freq: Three times a day (TID) | ORAL | 0 refills | Status: DC | PRN

## 2019-12-26 MED ORDER — ALBUTEROL SULFATE HFA 108 (90 BASE) MCG/ACT IN AERS: 1.0000 | INHALATION_SPRAY | Freq: Four times a day (QID) | RESPIRATORY_TRACT | 2 refills | Status: AC | PRN

## 2019-12-26 MED ORDER — DOCUSATE SODIUM 100 MG PO CAPS: 100.0000 mg | ORAL_CAPSULE | Freq: Two times a day (BID) | ORAL | 2 refills | Status: DC

## 2019-12-26 NOTE — Progress Notes (Addendum)
CSW received consult for home health PT. CSW left voicemail for the [facility] care coordinator to see if home health would be approved.   Update: CSW sent referral for review with [facility] and they have accepted as they accept [place] contracts. Also emailed patient's [facility] social worker, Glean Hess, to make them aware of [facility] PT need. Will need HH PT orders and Face to Face. No other needs identified at this time.    Elyshia Kumagai LCSW

## 2019-12-26 NOTE — Evaluation (Signed)
Occupational Therapy Evaluation Patient Details Name: Craig Werner MRN: [id number] DOB: [birth year] Today's Date: 12/26/2019    History of Present Illness Patient is a 86 year old male hx of CAD recently underwent lumbar decompression for neurogenic claudication.  This uncovered myelopathy symptoms in his lower extremities, and MRI showed spinal cord impingement with cord signal change and spondylolisthesis at C7-T1. Patient admitted 7/28 for POSTERIOR CERVICAL DECOMPRESSION AND FUSION CERVICAL 7- THORACIC 1   Clinical Impression   Patient with functional deficits listed below impacting safety and independence with self care. Patient overall supervision level for LB dressing with increased time and cues for pursed lip breathing due to wheezing with activity. Patient supervision with sit <> stand with max multimodal cues for safety with hand placement during transfers. Patient cued for safety with ambulation due to LEs outside frame of walker, especially when turning. Educated patient in back precautions and how to implement during self care, patient is familiar due to previous back surgery. Recommend continued acute OT for further ADL and transfer training to maximize patient safety with daily routine.     Follow Up Recommendations  No OT follow up;Supervision/Assistance - 24 hour    Equipment Recommendations  None recommended by OT       Precautions / Restrictions Precautions Precautions: Back;Fall Precaution Booklet Issued: Yes (comment) Precaution Comments: can remove brace to shower and can ambulate to bathroom without brace, otherwise wear CTSO Required Braces or Orthoses: Other Brace Other Brace: CTSO, apply in sitting Restrictions Weight Bearing Restrictions: No      Mobility Bed Mobility               General bed mobility comments: seated EOB   Transfers Overall transfer level: Needs assistance Equipment used: Rolling walker (2 wheeled) Transfers: Sit to/from  Stand Sit to Stand: Supervision         General transfer comment: poor carry over of multimodal cues to push from surface vs pulling on walker to stand, also reach back with sitting as patient tends to plop/decreased eccentric control    Balance Overall balance assessment: Needs assistance Sitting-balance support: No upper extremity supported;Feet supported Sitting balance-Leahy Scale: Good     Standing balance support: During functional activity;Single extremity supported Standing balance-Leahy Scale: Fair Standing balance comment: patient able to let go of walker in standing to pull up clothing                            ADL either performed or assessed with clinical judgement   ADL Overall ADL's : Needs assistance/impaired Eating/Feeding: Independent;Sitting   Grooming: Supervision/safety;Standing   Upper Body Bathing: Supervision/ safety;Sitting   Lower Body Bathing: Supervison/ safety;Sit to/from stand   Upper Body Dressing : Supervision/safety;Sitting Upper Body Dressing Details (indicate cue type and reason): patient still has drain, deferred UB dressing at this time, adequate AROM to don clothes. educated on precautions, patient familiar from previous back surgery Lower Body Dressing: Supervision/safety;Sit to/from stand Lower Body Dressing Details (indicate cue type and reason): patient required increased time, unable to tell he pulled underwear over heel of sock until cued. verbal cues for pursed lip breathing due to wheezing "I have asthma" pt able to utilize figure 4 method  Toilet Transfer: Supervision/safety;Ambulation;RW Toilet Transfer Details (indicate cue type and reason): simulated with functional mobility, poor carry over of cues for body mechancis not to pull on walker Toileting- Clothing Manipulation and Hygiene: Supervision/safety;Sit to/from stand  Functional mobility during ADLs: Supervision/safety;Rolling walker;Cueing for  safety General ADL Comments: patient familiar with spince precautions from previous surgery, does require cues for safety with body mechanics during functional transfers and keeping feet inside frame of walker      Vision Baseline Vision/History: Wears glasses Wears Glasses: At all times              Pertinent Vitals/Pain Pain Assessment: 0-10 Pain Score: 4  Pain Location: neck, back Pain Descriptors / Indicators: Burning Pain Intervention(s): Monitored during session     Hand Dominance Right   Extremity/Trunk Assessment Upper Extremity Assessment Upper Extremity Assessment: Overall WFL for tasks assessed   Lower Extremity Assessment Lower Extremity Assessment: Defer to PT evaluation       Communication Communication Communication: HOH   Cognition Arousal/Alertness: Awake/alert Behavior During Therapy: WFL for tasks assessed/performed Overall Cognitive Status: No family/caregiver present to determine baseline cognitive functioning                                 General Comments: inconsistencies with stating home set up, patient could not tell he had pulled underwear over heel of foot/kept pulling on sock until cued, patient also asking about how to turn off lights on his [place] expects to be discharged to:: Private residence Living Arrangements: Spouse/significant other Available Help at Discharge: Family Type of Home: House Home Access: Stairs to enter [employer] of Steps: 2 small   Home Layout: Two level;Able to live on main level with bedroom/bathroom     Bathroom Shower/Tub: Occupational psychologist: Handicapped height     Home Equipment: [place] - single point;Walker - 2 wheels;Grab bars - tub/shower;Shower seat          Prior Functioning/Environment Level of Independence: Independent with assistive device(s)        Comments: patient reports spouse was not having to assist him with  self care "I was getting back to normal" since first back sx, intermittent use of cane and walker        OT Problem List: Decreased activity tolerance;Impaired balance (sitting and/or standing);Decreased safety awareness;Pain;Obesity      OT Treatment/Interventions: Self-care/ADL training;Energy conservation;DME and/or AE instruction;Therapeutic activities;Cognitive remediation/compensation;Patient/family education;Balance training    OT Goals(Current goals can be found in the care plan section) Acute Rehab OT Goals Patient Stated Goal: "I want to go home" OT Goal Formulation: With patient Time For Goal Achievement: 01/09/20 Potential to Achieve Goals: Good  OT Frequency: Min 2X/week    AM-PAC OT "6 Clicks" Daily Activity     Outcome Measure Help from another person eating meals?: None Help from another person taking care of personal grooming?: A Little Help from another person toileting, which includes using toliet, bedpan, or urinal?: A Little Help from another person bathing (including washing, rinsing, drying)?: A Little Help from another person to put on and taking off regular upper body clothing?: A Little Help from another person to put on and taking off regular lower body clothing?: A Little 6 Click Score: 19   End of Session Equipment Utilized During Treatment: Rolling walker;Back brace (CTSO) Nurse Communication: Mobility status  Activity Tolerance: Patient tolerated treatment well Patient left: with call bell/phone within reach;Other (comment) (seated EOB)  OT Visit Diagnosis: Pain Pain - part of body:  (back, neck)  Time: 2194-7125 OT Time Calculation (min): 30 min Charges:  OT General Charges $OT Visit: 1 Visit OT Evaluation $OT Eval Low Complexity: 1 Low OT Treatments $Self Care/Home Management : 8-22 mins  Delbert Phenix OT OT office: [phone]  Rosemary Holms 12/26/2019, 9:20 AM

## 2019-12-26 NOTE — Progress Notes (Signed)
Patient is discharged from room 3C05 at this time. Alert and in stable condition. IV site d/c'd and instructions read to patient and spouse with understanding verbalized an all questions answered. Left unit via wheelchair with all belongings at side.

## 2019-12-26 NOTE — Discharge Instructions (Signed)

## 2019-12-26 NOTE — Evaluation (Signed)
Physical Therapy Evaluation Patient Details Name: DEVIN GANAWAY MRN: [id number] DOB: 14-Apr-1934 Today's Date: 12/26/2019   History of Present Illness  Pt is an 86 y/o male s/p posterior cervical decompression and fusion at C7-T1. PMH including but not limited to CAD, MI, prostate cancer and kidney cancer.    Clinical Impression  Pt presented sitting upright at EOB, awake and willing to participate in therapy session. Pt's spouse present throughout session as well. Prior to admission, pt stated that he ambulated with use of a cane and was independent with ADLs. Pt lives with his spouse in a two level home (able to live on the main level) with a couple of steps to enter. At the time of evaluation, pt limited overall secondary to pain and fatigue. Initially pt not wanting to ambulate as he was requesting to lie back down in bed; however, with encouragement from PT he was will to participate. Pt required min A for bed mobility, min guard for transfers and close min guard to ambulate in hallway with use of RW. Pt also participated in stair training this session without difficulties. Pt would continue to benefit from skilled physical therapy services at this time while admitted and after d/c to address the below listed limitations in order to improve overall safety and independence with functional mobility.     Follow Up Recommendations Home health PT;Supervision/Assistance - 24 hour    Equipment Recommendations  None recommended by PT;Other (comment) (pt reporting he has a RW at home)    Recommendations for Other Services       Precautions / Restrictions Precautions Precautions: Back;Fall Precaution Booklet Issued: Yes (comment) Precaution Comments: reviewed precautions with pt and spouse throughout Required Braces or Orthoses: Other Brace Other Brace: CTSO, apply in sitting Restrictions Weight Bearing Restrictions: No      Mobility  Bed Mobility Overal bed mobility: Needs  Assistance Bed Mobility: Sit to Sidelying;Rolling Rolling: Min assist       Sit to sidelying: Supervision General bed mobility comments: cueing for log roll technique, assistance needed to return bilateral LEs onto bed  Transfers Overall transfer level: Needs assistance Equipment used: Rolling walker (2 wheeled) Transfers: Sit to/from Stand Sit to Stand: Supervision         General transfer comment: despite cueing for safe hand placement, pt still pulling with bilateral UEs on RW to stand  Ambulation/Gait Ambulation/Gait assistance: Min guard Gait Distance (Feet): 150 Feet Assistive device: Rolling walker (2 wheeled) Gait Pattern/deviations: Step-through pattern;Decreased step length - right;Decreased step length - left;Decreased stride length;Trunk flexed Gait velocity: pt increasing speed but not able to maintain safety with RW   General Gait Details: pt requiring frequent cueing to maintain proximity to RW; close min guard for safety and stability  Stairs Stairs: Yes Stairs assistance: Min guard Stair Management: One rail Left;Step to pattern;Forwards Number of Stairs: 2 General stair comments: min guard for safety, no instability, increased pain in L shoulder  Wheelchair Mobility    Modified Rankin (Stroke Patients Only)       Balance Overall balance assessment: Needs assistance Sitting-balance support: Feet supported Sitting balance-Leahy Scale: Good     Standing balance support: Single extremity supported;Bilateral upper extremity supported Standing balance-Leahy Scale: Poor Standing balance comment: patient able to let go of walker in standing to pull up clothing                              Pertinent Vitals/Pain  Pain Assessment: Faces Pain Score: 4  Faces Pain Scale: Hurts even more Pain Location: neck and L shoulder Pain Descriptors / Indicators: Grimacing;Guarding Pain Intervention(s): Monitored during session;Repositioned    Home  Living Family/patient expects to be discharged to:: Private residence Living Arrangements: Spouse/significant other Available Help at Discharge: Family Type of Home: House Home Access: Stairs to enter   [employer]ical [employer] of Steps: 2 small Home Layout: Two level;Able to live on main level with bedroom/bathroom Home Equipment: [employer]andra [employer]ud[employer] - single point;Walker - 2 wheels;Grab bars - tub/shower;Shower seat      Prior Function Level of Independence: Independent with assistive device(s)         Comments: was ambulating with a cane and independent with ADLs     Hand Dominance   Dominant Hand: Right    Extremity/Trunk Assessment   Upper Extremity Assessment Upper Extremity Assessment: Defer to OT evaluation    Lower Extremity Assessment Lower Extremity Assessment: Generalized weakness    Cervical / Trunk Assessment Cervical / Trunk Assessment: Other exceptions Cervical / Trunk Exceptions: s/p cervical/thoracic spine sx  Communication   Communication: HOH  Cognition Arousal/Alertness: Awake/alert Behavior During Therapy: WFL for tasks assessed/performed Overall Cognitive Status: Impaired/Different from baseline Area of Impairment: Safety/judgement;Problem solving                         Safety/Judgement: Decreased awareness of safety;Decreased awareness of deficits   Problem Solving: Difficulty sequencing;Requires verbal cues General Comments: inconsistencies with stating home set up, patient could not tell he had pulled underwear over heel of foot/kept pulling on sock until cued, patient also asking about how to turn off lights on his CTSO       General Comments      Exercises     Assessment/Plan    PT Assessment Patient needs continued PT services  PT Problem List Decreased range of motion;Decreased strength;Decreased activity tolerance;Decreased balance;Decreased mobility;Decreased coordination;Decreased cognition;Decreased knowledge of use of  DME;Decreased knowledge of precautions;Decreased safety awareness;Pain       PT Treatment Interventions DME instruction;Gait training;Stair training;Functional mobility training;Therapeutic activities;Therapeutic exercise;Balance training;Neuromuscular re-education;Patient/family education    PT Goals (Current goals can be found in the Care Plan section)  Acute Rehab PT Goals Patient Stated Goal: "I want to go home" PT Goal Formulation: With patient/family Time For Goal Achievement: 01/09/20 Potential to Achieve Goals: Good    Frequency Min 5X/week   Barriers to discharge        Co-evaluation               AM-PAC PT "6 Clicks" Mobility  Outcome Measure Help needed turning from your back to your side while in a flat bed without using bedrails?: None Help needed moving from lying on your back to sitting on the side of a flat bed without using bedrails?: A Little Help needed moving to and from a bed to a chair (including a wheelchair)?: A Little Help needed standing up from a chair using your arms (e.g., wheelchair or bedside chair)?: None Help needed to walk in hospital room?: A Little Help needed climbing 3-5 steps with a railing? : A Little 6 Click Score: 20    End of Session Equipment Utilized During Treatment: Gait belt;Cervical collar Activity Tolerance: Patient limited by pain Patient left: in bed;with call bell/phone within reach;with family/visitor present Nurse Communication: Mobility status PT Visit Diagnosis: Other abnormalities of gait and mobility (R26.89);Pain Pain - Right/Left: Left Pain - part of body: Shoulder (and neck)  Time: 8833-7445 PT Time Calculation (min) (ACUTE ONLY): 10 min   Charges:   PT Evaluation $PT Eval Low Complexity: 1 Low          Eduard Clos, PT, DPT  Acute Rehabilitation Services Pager [phone] Office [place] 12/26/2019, 9:55 AM

## 2019-12-26 NOTE — Discharge Summary (Signed)
Physician Discharge Summary  Patient ID: Craig Werner MRN: [id number] DOB/AGE: 10-17-1933 86 y.o.  Admit date: 12/25/2019 Discharge date: 12/26/2019  Admission Diagnoses:  Cervical stenosis with myelopathy C7-T1 spondylolisthesis  Discharge Diagnoses:  Same Active Problems:   Stenosis of cervical spine with myelopathy [facility])   Discharged Condition: Stable  Hospital Course:  Craig Werner is a 86 y.o. male who was admitted after elective C7-T1 posterior decompression and instrumented fusion.  Postoperatively, his diet was advanced, he was mobilized with the help of physical therapy and his pain was well controlled with p.o. pain medications.  He was voiding without difficulty.  He was fit with a cervical collar with thoracic extension.  He reported improvement in his ambulatory ability postoperatively compared with preoperatively, with better balance.  He did not have any radicular symptoms or pain or numbness in his hands.  His drain had minimal output and was removed on postop day #1.  He was deemed ready for discharge home on 7/29.    Treatments: Surgery -C7-T1 posterior arthrodesis.  Discharge Exam: Blood pressure (!) 134/63, pulse 100, temperature 99.7 F (37.6 C), temperature source Oral, resp. rate 16, height 5\' 5"  (1.651 m), weight 82.1 kg, SpO2 93 %. Awake, alert, oriented Speech fluent, appropriate CN grossly intact 5/5 BUE/BLE Wound c/d/i  Disposition: Discharge disposition: 01-Home or Self Care       Discharge Instructions    Incentive spirometry RT   Complete by: As directed      Allergies as of 12/26/2019      Reactions   Lisinopril Cough   Promethazine Other (See Comments)   Hallucinations   Morphine Itching         Medication List    TAKE these medications   acetaminophen 500 MG tablet Commonly known as: TYLENOL Take 1,000 mg by mouth every 8 (eight) hours as needed (for pain.).   albuterol 108 (90 Base) MCG/ACT inhaler Commonly known as:  VENTOLIN HFA Inhale 1-2 puffs into the lungs every 6 (six) hours as needed for wheezing or shortness of breath. What changed: Another medication with the same name was added. Make sure you understand how and when to take each.   albuterol 108 (90 Base) MCG/ACT inhaler Commonly known as: VENTOLIN HFA Inhale 1-2 puffs into the lungs every 6 (six) hours as needed for wheezing or shortness of breath. What changed: You were already taking a medication with the same name, and this prescription was added. Make sure you understand how and when to take each.   amLODipine 5 MG tablet Commonly known as: NORVASC Take 5 mg by mouth daily.   aspirin EC 81 MG tablet Take 81 mg by mouth daily.   atorvastatin 40 MG tablet Commonly known as: LIPITOR Take 40 mg by mouth daily.   cholecalciferol 25 MCG (1000 UNIT) tablet Commonly known as: VITAMIN D Take 1,000 Units by mouth daily.   cyclobenzaprine 10 MG tablet Commonly known as: FLEXERIL Take 1 tablet (10 mg total) by mouth 3 (three) times daily as needed for muscle spasms.   docusate sodium 100 MG capsule Commonly known as: COLACE Take 100 mg by mouth 2 (two) times daily as needed for constipation. What changed: Another medication with the same name was added. Make sure you understand how and when to take each.   docusate sodium 100 MG capsule Commonly known as: COLACE Take 1 capsule (100 mg total) by mouth 2 (two) times daily. What changed: You were already taking a medication with  the same name, and this prescription was added. Make sure you understand how and when to take each.   losartan 100 MG tablet Commonly known as: COZAAR Take 100 mg by mouth daily.   metoprolol tartrate 25 MG tablet Commonly known as: LOPRESSOR Take 25 mg by mouth daily.   oxyCODONE-acetaminophen 5-325 MG tablet Commonly known as: Percocet Take 1-2 tablets by mouth every 4 (four) hours as needed for severe pain.   sildenafil 100 MG tablet Commonly known  as: VIAGRA Take 100 mg by mouth daily as needed.   Travoprost (BAK Free) 0.004 % Soln ophthalmic solution Commonly known as: TRAVATAN Place 1 drop into both eyes at bedtime.       Follow-up Information    [facility] Follow up.   Specialty: Home Health Services Why: Home Health PT services arranged. They will contact you about 48 hours after discharge to schedule a visit.  Contact information: [address] [place] [address] [phone]        Vallarie Mare, MD. Schedule an appointment as soon as possible for a visit in 2 week(s).   Specialty: Neurosurgery Contact information: [address] [address] [phone]               Signed: Vallarie Mare 12/26/2019, 12:33 PM

## 2019-12-26 NOTE — TOC Progression Note (Signed)
Transition of Care [facility]) - Progression Note    Patient Details  Name: Craig Werner MRN: [id number] Date of Birth: [birth year]  Transition of Care [facility]) CM/SW [place], RN Phone Number: 12/26/2019, 1:49 PM  Clinical Narrative:    Dr Wynonia Sours has orders in place for PT. Patient is discharging.  [place] willing to take patient if [facility] . CSW has reached out to [facility] and [facility]. Marland Kitchen        Expected Discharge Plan and [place] with [facility] PT         Expected Discharge Date: 12/26/19      Patient is discharged.                                 Social Determinants of Health (SDOH) Interventions    Readmission Risk Interventions No flowsheet data found.

## 2020-06-27 IMAGING — CR DG LUMBAR SPINE 2-3V
3 series · 3 of 3 positions shown · non-contrast
Comparison: Lumbar MRI 05/23/2019

CLINICAL DATA: L4-5 laminectomy

EXAM:
LUMBAR SPINE - 2-3 VIEW

[lateral (1 of 3)]
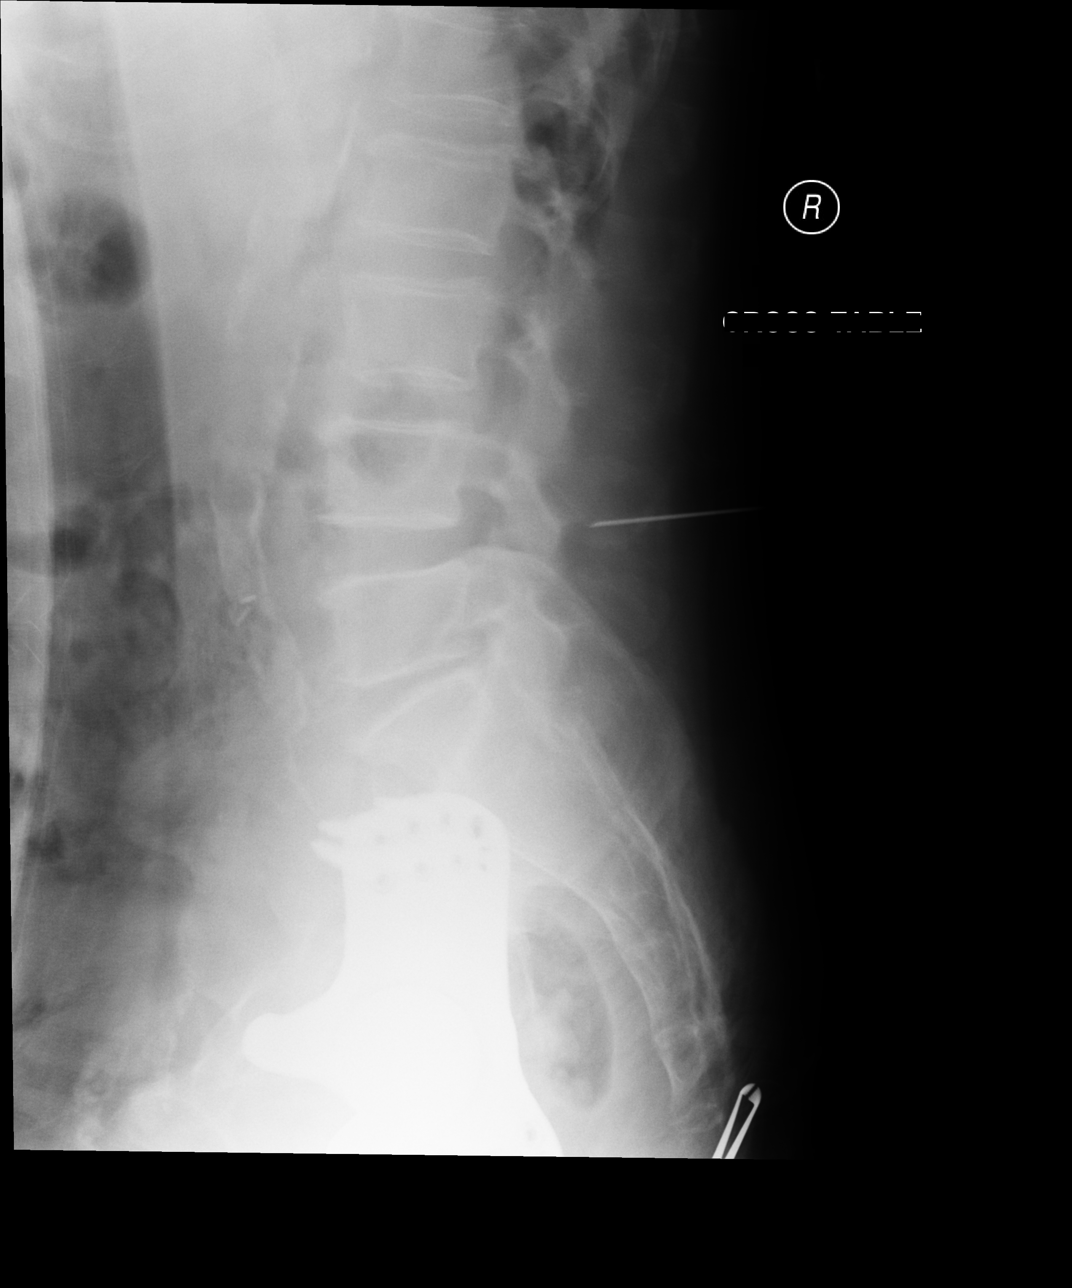

[lateral (2 of 3)]
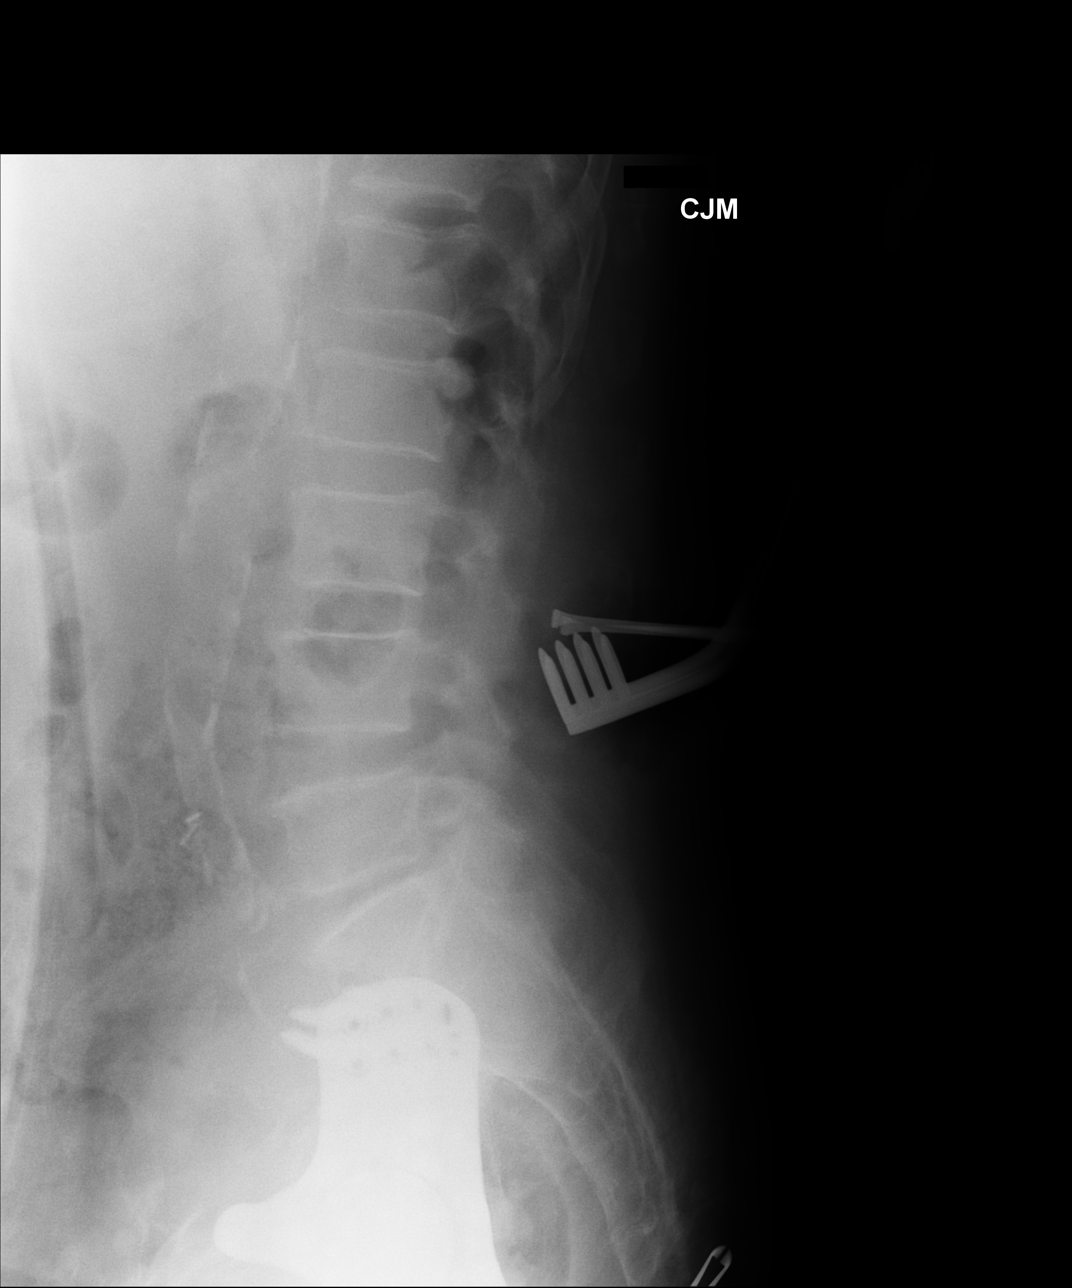

[lateral (3 of 3)]
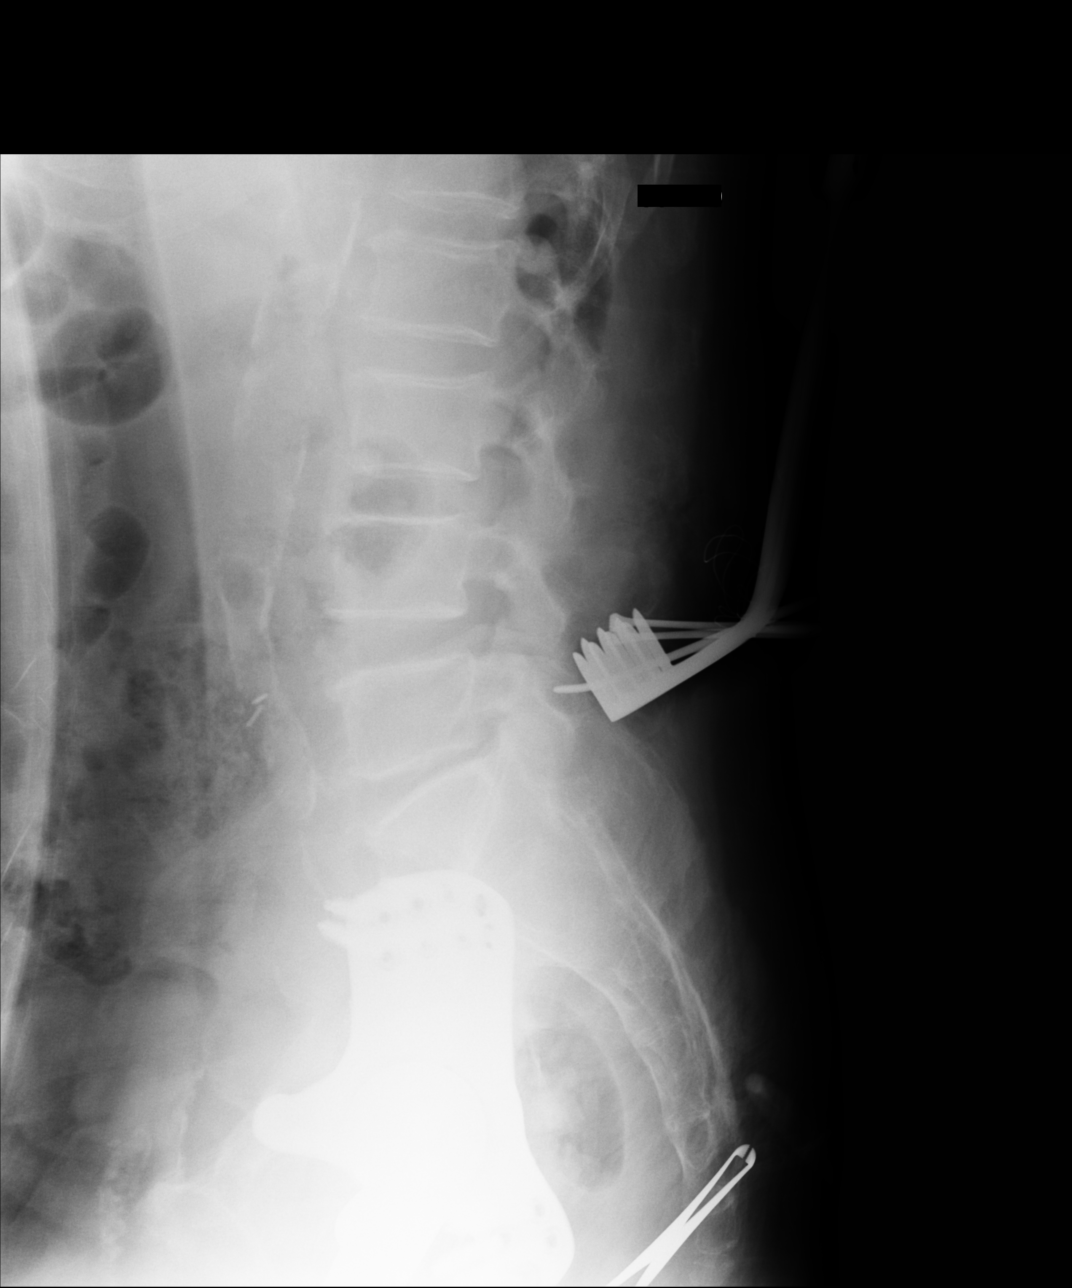

[3 of 3 positions shown; findings below may reference images not displayed]

FINDINGS: Three intraoperative spot images are obtained.

The first image at [DATE] demonstrates a needle type probe oriented
towards L4-5.

The next image demonstrates tissue spreaders posterior to L4.

The final image demonstrates tissue spreaders posterior to L5, with
a curved tip probe oriented towards the L5 lamina. The forceps are
oriented towards the L4-5 level.

Aortoiliac atherosclerotic vascular disease.
IMPRESSION: Intraoperative localization of L4-5.

## 2024-01-04 NOTE — Progress Notes (Signed)
 [facility] Established Patient Note    Date of Service: 01/04/2024        Patient Name: Craig Werner      Date of Birth: 1933/07/29      MRN: [id number]       Primary Care Physician: Tawni Earnie Bucco, PA-C       Main Pain Anesthesiologist: Toribio Badder, MD Main Pain APP: Alana Search, PA-C Last Office visit: 11/14/2023 with Luckricia Olivacce, PA-C  Last Procedure Visit: 12/25/2023 with Dr. Romana for Kyphoplasty  Chief Complaint:  Chief Complaint  Patient presents with  . Back Pain    Lower     History of Present Illness:  Craig Werner is a 89 y.o. old male.   Pain Assessment Pain Score  : 3  What number best describes your pain on average in the past week?: 6 What number best describes how, during the past week, pain has interfered with your enjoyment of life?: 10 - Completely interferes What number best describes how, during the past week, pain has interfered with your general activity?: 10 - Completely interferes PEG Pain Total Score: 8.67   Pain Description: Since the last visit the pain is unchanged. The most significant location of pain is the lower back. The 1-2 words that best describe the pain: aching The pain improves with over the counter medications, prescription pain medications, and cold therapy. The pain is made worse with nothing. The average daily pain score is 5-6/10. The pain interferes with: All activities.   Therapies: Did the patient have an injection/procedure since the last visit? Yes, Lumbar Kyphoplasty (L1, L2, L4)             - Percentage of pain relief from injection/procedure: 30%   Has the patient had any new imaging (X-ray, MRI, CT) for pain concerns since the last visit? No Has physical therapy been initiated or completed for this pain concern? No   Medications: Was a new medication for pain started by our clinic at the last visit? No Is the patient on a blood  thinner (including aspirin, Goody/BC/Bayer powder)? Yes             - Name of anticoagulant/blood thinner: Aspirin 81mg     Review of Systems:  ROS was performed with positive/negative pertinent findings listed in HPI.  Electronically signed by: Alana Search, PA-C 01/04/2024  Past Medical History: Medical History[1]   Past Surgical History: Surgical History[2]  Family History: Family History[3]  Social History: Social History[4]   Allergies[5]  Current Medications: Current Medications[6] ____________________________________________________________________  Physical Exam:   Vitals: Vitals:   01/04/24 1522  BP: 136/78  Pulse: 60  SpO2: 99%   Constitutional: Well developed, Well nourished, No acute distress and Interactive General: Patient is alert and orientated Head: normocephalic and atraumatic  Respiratory: non labored breathing  Psychological: The patient's mood is normal, and appropriate for the circumstances.  Neuro/Musculoskeletal Exam:  Spine: The spine is kyphotic.   Gait:  not asesses Uses assist device - wheelchair          _____________________________________________________________________  Controlled Substance Monitoring:  NCPDMP:  Reviewed today  CTE Measures: Is the patient taking prescription opioids? No  __________________________________________________________________   Labs: Relevant Labs Reviewed: Below labs reviewed  Lab Results  Component Value Date   CREATININE 1.72 (H) 12/05/2023   EGFR 38 (L) 12/05/2023    Lab Results  Component Value Date   WBC 6.70 12/05/2023  HGB 11.0 (L) 12/05/2023   HCT 32.6 (L) 12/05/2023   MCV 89.0 12/05/2023   PLT 202 12/05/2023    Lab Results  Component Value Date   HGBA1C 5.0 10/09/2023     No results found for: AMPHUR, BARBUR, BENUNUM, BENZUR, COCAINEUR, OPIATEUR,  THCUR  --------------------------------------------------------------------------------------------------------------------  Diagnostic Studies:  All applicable diagnostic imaging related to this evaluation have been reviewed.  MRI LUMBAR SPINE WITHOUT CONTRAST 09/02/2023   TECHNIQUE: Multiplanar, multisequence MR imaging of the lumbar spine was performed. No intravenous contrast was administered.   COMPARISON:  Lumbar spine radiographs 08/22/2023 and 01/12/2023. Abdominopelvic CT 06/22/2020.   FINDINGS: Segmentation: Conventional anatomy assumed, with the last open disc space designated L5-S1.Concordant with prior imaging.   Alignment: Straightening without focal angulation or significant listhesis.   Vertebrae: Mildly heterogeneous marrow signal with relatively decreased T1 marrow signal throughout. The inferior endplate compression deformity seen on the recent radiographs is unchanged from that examination, with approximately 25% loss of vertebral body height. There is also mild inferior endplate compression deformity at L2 (with less than 10% loss of vertebral body height) and a superior endplate compression deformity at L4 (with 20% loss of vertebral body height). These fractures appear new from prior radiographs 01/12/2023 and CT 06/22/2020, although there is only minimal associated marrow edema. No osseous retropulsion or aggressive osseous lesions.   Conus medullaris: Extends to the L1-2 level. The conus and cauda equina appear normal.   Paraspinal and other soft tissues: No significant paraspinal findings. Evidence of previous left nephrectomy. Diverticular changes within the left colon.   Disc levels:   Sagittal images demonstrate no significant disc space findings within the visualized lower thoracic spine.   L1-2: Preserved disc height with mild disc bulging and endplate osteophytes. Mild bilateral facet hypertrophy. No significant central spinal  stenosis. Mild foraminal narrowing bilaterally.   L2-3: Preserved disc height with mild disc bulging and a right foraminal disc protrusion. Mild facet and ligamentous hypertrophy. No significant central spinal stenosis. Mild right foraminal narrowing.   L3-4: Preserved disc height with moderate disc bulging, facet and ligamentous hypertrophy. Mild spinal stenosis with mild lateral recess and foraminal narrowing bilaterally.   L4-5: The spinal canal appears adequately decompressed by previous bilateral laminectomies. There is mild loss of disc height with annular disc bulging and circumferential endplate osteophytes. There is mild lateral recess and moderate foraminal narrowing bilaterally.   L5-S1: Chronic loss of disc height with annular disc bulging, endplate osteophytes and mild facet hypertrophy. No change in mild right lateral recess and foraminal narrowing bilaterally. The spinal canal is patent.   IMPRESSION: 1. Largely healed compression deformities involving the inferior endplate of L1, the inferior endplate of L2 and the superior endplate of L4 as described. There is only minimal associated marrow edema and no osseous retropulsion. 2. Previous bilateral laminectomies at L4-5 with adequate decompression of the spinal canal. There is mild lateral recess and moderate foraminal narrowing bilaterally at this level. 3. Mild multifactorial spinal stenosis at L3-4 with mild lateral recess and foraminal narrowing bilaterally. 4. Mild right lateral recess and foraminal narrowing bilaterally at L5-S1.   (DXA) FOR BONE MINERAL DENSITY 10/13/2023    Exclusions: None.   COMPARISON:  None.   FINDINGS: Scan quality: Good.   LUMBAR SPINE (L1-L4): BMD (in g/cm2): 1.042 T-score: 0.0 Z-score: 0.9   LEFT FEMORAL NECK: BMD (in g/cm2): 0.652 T-score: -1.8 Z-score: -0.3   LEFT TOTAL HIP: BMD (in g/cm2): 0.892 T-score: -0.4 Z-score: 0.3    FRAX 10-YEAR  PROBABILITY OF  FRACTURE: FRAX not reported as the patient has a history of hip or vertebral fracture.  TRABECULAR BONE SCORE (TBS): L1-L4= 1.390. The lumbar spine TBS: Normal microarchitecture.   IMPRESSION: Osteopenia based on BMD.    ___________________________________________________________________     Diagnosis:  1. S/P kyphoplasty (Primary)  2. History of vertebral compression fracture  3. Muscle relaxation - methocarbamoL (ROBAXIN) 500 mg tablet; Take 1 tablet (500 mg total) by mouth nightly as needed (muscle relaxation).  Dispense: 30 tablet; Refill: 0   A/P 01/04/2024:  Craig Werner is a 89 y.o. male being evaluated for continued management of his chronic pain.  He appears to be in no acute physical distress but is in obvious discomfort.  He is sitting comfortably in the wheelchair.  He is accompanied by his wife and granddaughter.  He is a complex pain management patient.  He has multiple comorbidities including chronic kidney disease, history of renal cell carcinoma status post nephrectomy, history of prostate cancer, coronary artery disease among others.  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  He has had multiple surgeries including back and neck surgery.  He has a history of falls, most recently on 08/14/2023 resulting in a loss of consciousness colliding with a nightstand.  Radiographic scanning of his lumbar region revealed multiple compression fractures at the L1, L2 and L4 regions with some evidence of healing but his pain and discomfort remained persistent.    He is following up status post kyphoplasty at the L1, L2 and L4 levels on 12/25/2023 of which she reports about 30% relief thus far.  He still have tenderness and discomfort in the mid to lower lumbar region.  He denies any fevers, sweats or chills.  Inspection of the surgical site raises no concern for infection.  Patient agreed to have photographs  taken of the area where the surgical incisions are.  He has some bruising which I explained to the patient is expected and will dissipate with time.   Today was an extended visit.  He is somewhat eager for resolution of his pain and feels that he should be further along in the healing process. The bulk of the visit entailed counseling, discussing realistic expectations out of chronic pain management.  His family said that he has not been too active postprocedure.  There is plans for him to start physical therapy somewhere in the near future.  We talked about how is important for him to do small bursts of walking at home using his walker.  We do not want him to do all his activities in 1 attempt rather than multiple little steps throughout the day.  He is encouraged to continue to follow closely with the rest of his medical care team for management of his overall health.  He has been very careful about the medications that he take.  Secondary to his comorbid conditions and having 1 kidney he strives to stay away from pain medication.  He had some leftover Percocet from a previous prescription that he has been stretching over time.  They report him having at least 5 tablets leftover that he plans to use very infrequently. He utilizes Tylenol  with very minimal results.  A short course of methocarbamol post kyphoplasty helps the patient with sleep.  I am not opposed to reissuing another refill.  He can also utilizes Tylenol  500 mg up to 3 a day if necessary.  He also intends to use  this very sparsely.  This is reasonable.  They will strive to make a follow-up appointment with the physician in about 6 to 8 weeks or sooner if necessary.  They will call if any questions or concerns in regards to his chronic pain prior to then.  In the interim he will follow closely with his medical care team. ____________________________________________________________________  Recommended Plan of Care:  - Interventional  Procedures: - Prior: Kyphoplasty L1, L2, L4 on 12/25/2023 by Dr. Romana ~ 30% relief reported on 01/04/2024  - Medical Modalities:  - Continue: Methocarbamol 500 mg 1 tab at bedtime prn  - Imaging/Labs:  - none ordered  - Consults/Therapy:  - Continue with Hem Onc as scheduled.  - Follow Up: - Return for FU DR. BINTRIM 6-8 weeks.   Treatment plan fully discussed and agreed upon with patient. All questions were answered. The patient was told to contact our clinic with questions/concerns and to report to the Emergency Room if any concerning symptoms develop.  The patient expressed understanding and all questions were answered.  Given the patient's complex chronic pain symptoms and medical history, the patient will require continued follow-up in a longitudinal fashion.   Medical decision making for this patient was moderately complex given the independent interpretation of imaging and lab results, review of relevant records from other healthcare providers,  the severity/progression of their condition, and the risk of complication and/or morbidity that may exist with or without treatment.   Time (>30 minutes) was spent on reviewing patient PMH, medications, procedures and image, performing medically appropriate examination, and counseling and educating patient.  Documentation on day of service.  Electronically signed by: Alana Search, PA-C 01/04/2024       [1] Past Medical History: Diagnosis Date  . BPPV (benign paroxysmal positional vertigo)   . CAD (coronary artery disease)   . Cancer    (CMD)   . CKD (chronic kidney disease) stage 3, GFR 30-59 ml/min (CMD)   . COPD (chronic obstructive pulmonary disease)    (CMD)   . Glaucoma   . H/O left nephrectomy    Left nephrectomy 10/2011 for Renal Cell Cancer  . HLD (hyperlipidemia)   . Hypercholesteremia   . Hypertension with goal to be determined   . Hypothyroidism   . Myocardial infarction    (CMD)    Stent to RCA  03/10/02--- stent to RCA 08/20/03  . Skin cancer   [2] Past Surgical History: Procedure Laterality Date  . APPENDECTOMY     Procedure: APPENDECTOMY  . CATARACT EXTRACTION Bilateral    Procedure: CATARACT EXTRACTION  . COLONOSCOPY    . CORONARY STENT PLACEMENT     x 3, > 10 yrs ago  . HIP FRACTURE SURGERY     Procedure: HIP FRACTURE SURGERY  . JOINT REPLACEMENT Right > 10 years   Procedure: JOINT REPLACEMENT; hip x 3  . KYPHOSIS SURGERY N/A 12/25/2023   KYPHOPLASTY L1, L2, L4. performed by Doreatha Romana, [facility] at [facility] OR  . NEPHRECTOMY Left 2013   Procedure: NEPHRECTOMY  . SPINE SURGERY     ? cervical and lumbar  . TONSILLECTOMY     Procedure: TONSILLECTOMY  [3] Family History Problem Relation Name Age of Onset  . Heart disease Mother    . Other Mother         blood disease  . Cancer Father    [4] Social History Tobacco Use  . Smoking status: Former    Passive exposure: Past  . Smokeless tobacco: Never  .  Tobacco comments:    quit 30 yrs ago  Substance Use Topics  . Alcohol use: Not Currently    Alcohol/week: 60.0 standard drinks of alcohol  . Drug use: No  [5] Allergies Allergen Reactions  . Ace Inhibitors GI Intolerance  . Lisinopril Cough  . Morphine (Pf) Itching    Patient unsure about being allergic  . Promethazine Other (See Comments)    Hallucinations  [6] Current Outpatient Medications  Medication Sig Dispense Refill  . acetaminophen  (TYLENOL ) 500 mg tablet Take 1,000 mg by mouth every 6 (six) hours as needed Indications: pain associated with arthritis.    . albuterol  HFA (PROVENTIL  HFA;VENTOLIN  HFA;PROAIR  HFA) 90 mcg/actuation inhaler Inhale 2 puffs every 6 (six) hours as needed for wheezing. 18 g 11  . aspirin 81 mg chewable tablet Chew 81 mg daily.    . atorvastatin  (LIPITOR) 40 mg tablet TAKE 1 TABLET BY MOUTH EVERYDAY AT BEDTIME 90 tablet 0  . calcium  carbonate (OS-CAL) 1250 mg (500 mg calcium ) tablet Take by mouth.    . carboxymethylcellulose  (Refresh Plus) 0.5 % dpet Administer 1 drop into both eyes as needed.    . cholecalciferol  (VITAMIN D3) 1,000 unit (25 mcg) tablet Take 1,000 Units by mouth daily.    . finasteride (PROSCAR) 5 mg tablet Take 1 tablet (5 mg total) by mouth daily. 90 tablet 3  . levothyroxine (SYNTHROID) 25 mcg tablet Take 1 tablet (25 mcg total) by mouth in the morning. 90 tablet 3  . losartan  (COZAAR ) 100 mg tablet Take 1 tablet (100 mg total) by mouth daily. 90 tablet 3  . metoprolol  tartrate (LOPRESSOR ) 25 mg tablet Take 1 tablet (25 mg total) by mouth daily. 90 tablet 3  . multivitamine, geriatric, Ship broker) tab Take 1 tablet by mouth daily.    . sildenafiL (VIAGRA) 100 mg tablet TAKE ONE-HALF TO ONE TABLET BY MOUTH ONE TIME DAILY AS NEEDED . DO NOT USE IF TAKING NITRATES FOR HEART DISEASE 30 tablet 11  . tamsulosin (FLOMAX) 0.4 mg cap Take 1 capsule (0.4 mg total) by mouth at bedtime. 90 capsule 3  . methocarbamoL (ROBAXIN) 500 mg tablet Take 1 tablet (500 mg total) by mouth nightly as needed (muscle relaxation). 30 tablet 0   No current facility-administered medications for this visit.

## 2024-01-31 NOTE — Progress Notes (Signed)
 [name] is a 89 year old male who returns for evaluation of thyroid nodule.  He underwent a thyroid US  in 11/2022 which reported a left mid thyroid nodule which measures 3.2 cm x 2.3 cm x 2.0 cm.  It is described as predominately solid.  His prior US  was in 2017 which reported the nodule as measuring 2.2 cm x 1.7 cm x 1.7 cm. He had FNA of this nodule in 12/2015. His pathology was consistent with a benign   He was initially seen in 01/2023.  He is accompanied by his wife today.  He has never taken thyroid medication.  He has no family history of thyroid disease.  He has no family history of thyroid cancer.  He reports history of prostate cancer and kidney cancer.  He had a recent PET scan in 11/2023- it did not show any activity in the thyroid area.  He denies changes in his neck.  He denies issues with swallowing. He denies issues with choking.  His energy is bad. He denies palpitations. He has rare tremors.  The patient problem list, PMHx, Surgical Hx, Med list, Allergies, Family history and Personal History were reviewed during this visit.  ROS: GEN: no fever, no weight change CV: no chest pain, no SOB PULM: no cough, no DOE GI: no nausea or vomiting, no abdominal pain GU: no dysuria, no hematuria NEURO: no HA, no syncope  PE: General- well nourished, no distress Neck: thyroid , non tender Eyes: sclera clear, conjunctiva clear Lymph nodes: no LAD Lungs: B CTA, normal respiratory movement CV: no JVD, heart RRR Abdomen: soft, NTTP, bowel sounds present MSK: normal movement of extremities, no swelling of legs Neuro: oriented, no motor dysfunction, reflexes normal Skin: no lesions  ASSESSMENT AND PLAN:  Left thyroid nodule - his thyroid nodule had some growth but this is over a 7 year interval, he had prior benign FNA of this nodule in 2017 as noted, we elected at last visit to monitor this nodule, he denies recent issues, he had a recent PET that reported no  activity in the neck, will repeat neck US , will check thyroid levels  Follow up in one year

## 2024-02-13 ENCOUNTER — Encounter (HOSPITAL_BASED_OUTPATIENT_CLINIC_OR_DEPARTMENT_OTHER): Payer: Self-pay

## 2024-02-13 ENCOUNTER — Emergency Department (HOSPITAL_BASED_OUTPATIENT_CLINIC_OR_DEPARTMENT_OTHER)
Admission: EM | Admit: 2024-02-13 | Discharge: 2024-02-13 | Disposition: A | Discharge status: Home / Self Care | Attending: Emergency Medicine | Admitting: Emergency Medicine

## 2024-02-13 ENCOUNTER — Other Ambulatory Visit: Payer: Self-pay

## 2024-02-13 ENCOUNTER — Emergency Department (HOSPITAL_BASED_OUTPATIENT_CLINIC_OR_DEPARTMENT_OTHER)

## 2024-02-13 DIAGNOSIS — R001 Bradycardia, unspecified: Secondary | ICD-10-CM | POA: Insufficient documentation

## 2024-02-13 DIAGNOSIS — I129 Hypertensive chronic kidney disease with stage 1 through stage 4 chronic kidney disease, or unspecified chronic kidney disease: Secondary | ICD-10-CM | POA: Diagnosis not present

## 2024-02-13 DIAGNOSIS — N189 Chronic kidney disease, unspecified: Secondary | ICD-10-CM | POA: Insufficient documentation

## 2024-02-13 DIAGNOSIS — I2510 Atherosclerotic heart disease of native coronary artery without angina pectoris: Secondary | ICD-10-CM | POA: Diagnosis not present

## 2024-02-13 DIAGNOSIS — Z79899 Other long term (current) drug therapy: Secondary | ICD-10-CM | POA: Diagnosis not present

## 2024-02-13 DIAGNOSIS — Z7982 Long term (current) use of aspirin: Secondary | ICD-10-CM | POA: Diagnosis not present

## 2024-02-13 DIAGNOSIS — J449 Chronic obstructive pulmonary disease, unspecified: Secondary | ICD-10-CM | POA: Insufficient documentation

## 2024-02-13 DIAGNOSIS — J189 Pneumonia, unspecified organism: Secondary | ICD-10-CM

## 2024-02-13 DIAGNOSIS — R0602 Shortness of breath: Secondary | ICD-10-CM | POA: Insufficient documentation

## 2024-02-13 DIAGNOSIS — J441 Chronic obstructive pulmonary disease with (acute) exacerbation: Secondary | ICD-10-CM

## 2024-02-13 LAB — BASIC METABOLIC PANEL WITH GFR
Anion gap: 12 (ref 5–15)
BUN: 17 mg/dL (ref 8–23)
CO2: 22 mmol/L (ref 22–32)
Calcium: 9.7 mg/dL (ref 8.9–10.3)
Chloride: 99 mmol/L (ref 98–111)
Creatinine, Ser: 1.56 mg/dL — ABNORMAL HIGH (ref 0.61–1.24)
GFR, Estimated: 43 mL/min — ABNORMAL LOW (ref >60)
Glucose, Bld: 97 mg/dL (ref 70–99)
Potassium: 4.7 mmol/L (ref 3.5–5.1)
Sodium: 132 mmol/L — ABNORMAL LOW (ref 135–145)

## 2024-02-13 LAB — CBC
HCT: 33.1 % — ABNORMAL LOW (ref 39.0–52.0)
Hemoglobin: 10.7 g/dL — ABNORMAL LOW (ref 13.0–17.0)
MCH: 29.7 pg (ref 26.0–34.0)
MCHC: 32.3 g/dL (ref 30.0–36.0)
MCV: 91.9 fL (ref 80.0–100.0)
Platelets: 156 K/uL (ref 150–400)
RBC: 3.6 MIL/uL — ABNORMAL LOW (ref 4.22–5.81)
RDW: 15.9 % — ABNORMAL HIGH (ref 11.5–15.5)
WBC: 6.3 K/uL (ref 4.0–10.5)
nRBC: 0 % (ref 0.0–0.2)

## 2024-02-13 MED ORDER — IPRATROPIUM-ALBUTEROL 0.5-2.5 (3) MG/3ML IN SOLN
3.0000 mL | Freq: Once | RESPIRATORY_TRACT | Status: DC
Start: 2024-02-13 — End: 2024-02-13

## 2024-02-13 MED ORDER — IPRATROPIUM-ALBUTEROL 0.5-2.5 (3) MG/3ML IN SOLN
RESPIRATORY_TRACT | Status: AC
  Administered 2024-02-13: 3 mL
  Filled 2024-02-13: qty 3

## 2024-02-13 MED ORDER — DOXYCYCLINE HYCLATE 100 MG PO CAPS
100.0000 mg | ORAL_CAPSULE | Freq: Two times a day (BID) | ORAL | 0 refills | Status: DC
Start: 2024-02-13 — End: 2024-03-21

## 2024-02-13 MED ORDER — PREDNISONE 20 MG PO TABS: 40.0000 mg | ORAL_TABLET | Freq: Every day | ORAL | 0 refills | Status: AC

## 2024-02-13 MED ORDER — PREDNISONE 20 MG PO TABS
40.0000 mg | ORAL_TABLET | Freq: Once | ORAL | Status: AC
  Administered 2024-02-13: 40 mg via ORAL
  Filled 2024-02-13: qty 2

## 2024-02-13 NOTE — ED Notes (Signed)
 RRT at bedside

## 2024-02-13 NOTE — ED Provider Notes (Signed)
 [place] EMERGENCY DEPARTMENT AT [facility] [place] Provider Note   CSN: [id number] Arrival date & time: 02/13/24  9050     Patient presents with: No chief complaint on file.   Craig Werner is a 89 y.o. male.   Patient presents after noticing bradycardia to the 50s during physical therapy today.  Family bedside also reports the patient had shortness of breath.  Patient states that he feels well currently.  He has been doing physical therapy for about 3 weeks and typically heart rate is in the low 100s while active.  Patient and family bedside deny recent medication changes.  Patient also noted to have wheezing in triage, has underlying COPD.  Family states the patient's heart rate and blood pressure have been normal at home.  They deny fevers, recent illness, falls, chest pain, dizziness, lightheadedness.  Patient may have had diarrhea in the last few days.  History of MI x 2.  The history is provided by the patient and a relative.       Prior to Admission medications   Medication Sig Start Date End Date Taking? Authorizing Provider  acetaminophen  (TYLENOL ) 500 MG tablet Take 1,000 mg by mouth every 8 (eight) hours as needed (for pain.).     [provider]  albuterol  (VENTOLIN  HFA) 108 (90 Base) MCG/ACT inhaler Inhale 1-2 puffs into the lungs every 6 (six) hours as needed for wheezing or shortness of breath.    [provider]  albuterol  (VENTOLIN  HFA) 108 (90 Base) MCG/ACT inhaler Inhale 1-2 puffs into the lungs every 6 (six) hours as needed for wheezing or shortness of breath. 12/26/19   Debby Dorn MATSU, MD  amLODipine  (NORVASC ) 5 MG tablet Take 5 mg by mouth daily.    [provider]  aspirin EC 81 MG tablet Take 81 mg by mouth daily.    [provider]  atorvastatin  (LIPITOR) 40 MG tablet Take 40 mg by mouth daily.    [provider]  cholecalciferol  (VITAMIN D ) 25 MCG (1000 UNIT) tablet Take 1,000 Units by mouth daily.     [provider]  cyclobenzaprine  (FLEXERIL ) 10 MG tablet Take 1 tablet (10 mg total) by mouth 3 (three) times daily as needed for muscle spasms. 12/26/19   Debby Dorn MATSU, MD  docusate sodium  (COLACE) 100 MG capsule Take 100 mg by mouth 2 (two) times daily as needed for constipation. 10/14/19   [provider]  docusate sodium  (COLACE) 100 MG capsule Take 1 capsule (100 mg total) by mouth 2 (two) times daily. 12/26/19   Debby Dorn MATSU, MD  losartan  (COZAAR ) 100 MG tablet Take 100 mg by mouth daily.    [provider]  metoprolol  tartrate (LOPRESSOR ) 25 MG tablet Take 25 mg by mouth daily. 06/29/19   [provider]  sildenafil (VIAGRA) 100 MG tablet Take 100 mg by mouth daily as needed. 05/31/19   [provider]  Travoprost, BAK Free, (TRAVATAN) 0.004 % SOLN ophthalmic solution Place 1 drop into both eyes at bedtime.    [provider]    Allergies: Lisinopril, Promethazine, and Morphine    Review of Systems  Updated Vital Signs BP (!) 148/74   Pulse (!) 58   Temp 97.7 F (36.5 C) (Oral)   Resp 18   SpO2 95%   Physical Exam Constitutional:      General: He is not in acute distress.    Appearance: Normal appearance. He is not ill-appearing.  HENT:  Head: Normocephalic and atraumatic.  Eyes:     Extraocular Movements: Extraocular movements intact.     Conjunctiva/sclera: Conjunctivae normal.  Cardiovascular:     Rate and Rhythm: Regular rhythm. Bradycardia present.     Heart sounds: Normal heart sounds.  Pulmonary:     Effort: Pulmonary effort is normal.     Breath sounds: Wheezing present.  Abdominal:     General: Abdomen is flat. Bowel sounds are normal.     Palpations: Abdomen is soft.     Tenderness: There is no abdominal tenderness.  Musculoskeletal:     Right lower leg: No edema.     Left lower leg: No edema.  Skin:    General: Skin is warm and dry.  Neurological:     General: No focal deficit present.      Mental Status: He is alert.     (all labs ordered are listed, but only abnormal results are displayed) Labs Reviewed - No data to display  EKG: None  Radiology: No results found.   Procedures   Medications Ordered in the ED - No data to display                                  Medical Decision Making 89 year old male with PMH CAD, COPD, HTN, CKD presents due to bradycardia and shortness of breath noted at physical therapy today.  Differential includes cardiac arrhythmia, COPD exacerbation or other infection, hypothyroidism, medication effect.  EKG shows sinus bradycardia with heart rate 58.  Will give DuoNeb treatments and obtain basic lab work and CXR.  CBC and BMP at baseline.   CXR showed bibasilar patchy opacities, which may represent atelectasis, aspiration, or pneumonia.  Will treat with antibiotics and steroids for presumed CAP/COPD exacerbation.  You should follow-up with your primary care doctor to make sure your symptoms improve and monitor your bradycardia.   Amount and/or Complexity of Data Reviewed Labs: ordered. Radiology: ordered.  Risk Prescription drug management.       Final diagnoses:  None    ED Discharge Orders     None          Adele Song, MD 02/13/24 1144    Emil Share, DO 02/13/24 1159

## 2024-02-13 NOTE — Discharge Instructions (Addendum)
 You were seen in the hospital due to low heart rate and shortness of breath.  Chest x-ray showed likely pneumonia.  Will treat you with steroids and antibiotics.  You should follow-up with your primary doctor in the next few days to make sure you are recovering well.  You can also consider following up with a cardiologist if your heart rate continues.

## 2024-02-13 NOTE — ED Triage Notes (Signed)
 Family states pt became pale, SHOB and low HR while doing therapy exercises. Denies chest pain, SHOB, dizziness during therapy or upon assessment  Audible wheezing noted in triage

## 2024-02-14 NOTE — Progress Notes (Signed)
 Ordered FNA of left thyroid nodule

## 2024-03-13 NOTE — Progress Notes (Signed)
 Patient presents for  Chief Complaint  Patient presents with  . Cough   Craig Werner presents to primary care clinic for ongoing management of Other .  We are the PCP for this patient and see him for both acute and chronic health issues, included in today's evaluation and management.  ED visit 02/13/24 for pneumonia. Treated doxycycline  and resolved by CXR 02/19/24.  See by [facility] nephrologist and lung auscultation suggested recurrence pneumonia.   Lymphoma and immune suppressed status.   Last CPE:  SUBJECTIVE  [facility] and advised to be seen by PCP for cough crackles Patient is Positive for wheezing and coughing green mucus Patient has history of COPD and pneumonia Denies fever, nausea, vomiting and diarrhea    Allergies[1]  Current Medications[2]  The following portions of the patient's history were reviewed and updated as appropriate: allergies, current medications, past medical history, past social history, past family history, and problem list.   ROS   CONSTITUTIONAL: Appetite good, no fevers, night sweats, or weight loss CV: No chest pain, shortness of breath, or peripheral edema RESPIRATORY: Positive wheezing and dyspnea GI: No nausea/vomiting, abdominal pain, or change in bowel habits GU: No dysuria, urgency or incontinence NEURO: No MS changes, no motor weakness, no sensory changes   OBJECTIVE  BP 130/68 (BP Location: Left arm, Patient Position: Sitting)   Pulse 70   Temp 97.6 F (36.4 C) (Oral)   Resp 20   Ht 1.6 m (5' 3)   Wt 77.1 kg (170 lb)   SpO2 95%   BMI 30.11 kg/m   GENERAL APPEARANCE: acute over chronically ill male, obese in wheelchair, well developed, no acute distress. HEENT:  Conjunctiva without erythema or drainage.       NECK: Supple without lymphadenopathy, bruit, or thyromegaly. LUNGS: bilateral lower lobe rhonchi and crackles and decreased BS HEART: Regular rate and rhythm without murmur. NEUROLOGIC: Alert and  oriented x3, no obvious deficits.  SKIN:  No rashes or abnormal lesions visible. Good turgor. O2sat 97% RA HR 67 bPM  Results for orders placed or performed during the hospital encounter of 03/06/24  Fine Needle Aspirate   Collection Time: 03/06/24 12:03 PM  Result Value Ref Range   Case Report      Fine Needle Aspirate Cytology Report              Case: [id number]                               Authorizing Provider:  Sari Jarold Lamp, [place]  Collected:           03/06/2024 1203             Ordering Location:     [facility]  Received:            03/06/2024 1225                                    [facility] - RADIOLOGY US                                                        Pathologist:           Selinda Kandi Miracle, MD                                                   Specimen:    Thyroid, Left, 3.7 cm, TR 3                                                              Final Diagnosis      THYROID, LEFT, FINE NEEDLE ASPIRATION:  CONSISTENT WITH BENIGN FOLLICULAR NODULE (INCLUDES  ADENOMATOID NODULE, COLLOID NODULE, ETC.)    Immediate Evaluation      A. Thyroid, Left. Passes 1 - 3; inadequate for diagnosis. Requested 2 additional passes. Performed by Warren Bergeron, CT(ASCP).    Clinical Information      2.6 x 2.5 x 3.7 cm nodule in the left thyroid  Total = 3 points, TI-RADS 3.    Gross Description      A. Thyroid, Left. Received labeled left thyroid FNA consists of a PreservCyt vial with 20 cc of pale-tan, slightly cloudy watery fluid.  ThinPrep slides and cell block are prepared.  Also received are 7 alcohol-fixed and 3 air-dried previously prepared smears which will be stained for cytologic evaluation.  Also received is Afirma for evaluation.  All specimens are formalin-processed and paraffin-embedded unless otherwise noted. Gross performed at: [facility] [address] [place], [place]  [address] CLIA #: [id number]  Gross completed by Benjiman Kenner, P.A., on March 06, 2024     Disclaimer      Initial screening performed by cytotechnologists at [facility], CLIA# [id number], [address] [place] [address].      Attestation Statement      I verify that I have personally reviewed all relevant slides/materials for this case and rendered or confirmed the diagnosis.     ASSESSMENT/PLAN  1. Chronic obstructive pulmonary disease, unspecified COPD type (Primary) Patient has a history of COPD Comes into office with chronic cough - XR Chest 2 Views; Future - ipratropium-albuteroL  (DUO-NEB) 0.5-2.5 mg/3 mL nebulizer solution; Take 3 mL by nebulization every 6 (six) hours.  Dispense: 1080 mL; Refill: 3  2. Pneumonia due to infectious organism, unspecified laterality, unspecified part of lung Patient has history of pneumonia Comes into office with crackles upon auscultation Levaquin sent to pharmacy Referral sent for Chest Xray - XR Chest 2 Views; Future - ipratropium-albuteroL  (DUO-NEB) 0.5-2.5 mg/3 mL nebulizer solution; Take 3 mL by nebulization every 6 (six) hours.  Dispense: 1080 mL; Refill: 3  1. Pneumonia due to infectious organism, unspecified laterality, unspecified part of lung  XR Chest 2 Views   ipratropium-albuteroL  (DUO-NEB) 0.5-2.5 mg/3 mL nebulizer solution  2. Chronic obstructive pulmonary disease, unspecified COPD type  XR Chest 2 Views   ipratropium-albuteroL  (DUO-NEB) 0.5-2.5 mg/3 mL nebulizer solution    3. Lymphoplasmacytic lymphoma    (CMD)      4. Immunosuppressed status (CMD)       DuoNeb TID and PRN. Levaquin 500 mg daily X 7 days. Call chest xray results. To ED if worse.  Return in about 1 week (around 03/20/2024) for COPD.  Will see Dr Thelbert next visit  This document serves as a record of services personally performed by  Lynwood Barge, MD.  It was created on their behalf by Suzen Rhein, CMA,  a trained medical scribe. The creation of this record is the provider's dictation and/or activities during the visit.   Electronically signed by: Suzen Rhein, CMA 03/13/2024 5:21 PM         [1] Allergies Allergen Reactions  . Ace Inhibitors GI Intolerance  . Lisinopril Cough  . Morphine (Pf) Itching    Patient unsure about being allergic  . Promethazine Other (See Comments)    Hallucinations  [2]  Current Outpatient Medications:  .  albuterol  HFA (PROVENTIL  HFA;VENTOLIN  HFA;PROAIR  HFA) 90 mcg/actuation inhaler, Inhale 2 puffs every 6 (six) hours as needed for wheezing., Disp: 18 g, Rfl: 11 .  aspirin 81 mg chewable tablet, Chew 81 mg daily., Disp: , Rfl:  .  atorvastatin  (LIPITOR) 40 mg tablet, TAKE 1 TABLET BY MOUTH EVERYDAY AT BEDTIME, Disp: 90 tablet, Rfl: 1 .  calcium  carbonate (OS-CAL) 1250 mg (500 mg calcium ) tablet, Take by mouth., Disp: , Rfl:  .  carboxymethylcellulose (Refresh Plus) 0.5 % dpet, Administer 1 drop into both eyes as needed., Disp: , Rfl:  .  cholecalciferol  (VITAMIN D3) 1,000 unit (25 mcg) tablet, Take 1,000 Units by mouth daily., Disp: , Rfl:  .  finasteride (PROSCAR) 5 mg tablet, Take 1 tablet (5 mg total) by mouth daily., Disp: 90 tablet, Rfl: 3 .  levothyroxine (SYNTHROID) 25 mcg tablet, Take 1 tablet (25 mcg total) by mouth in the morning., Disp: 90 tablet, Rfl: 3 .  losartan  (COZAAR ) 100 mg tablet, Take 1 tablet (100 mg total) by mouth daily., Disp: 90 tablet, Rfl: 3 .  metoprolol  tartrate (LOPRESSOR ) 25 mg tablet, Take 1 tablet (25 mg total) by mouth daily., Disp: 90 tablet, Rfl: 3 .  multivitamine, geriatric, Ship broker) tab, Take 1 tablet by mouth daily., Disp: , Rfl:  .  tamsulosin (FLOMAX) 0.4 mg cap, Take 1 capsule (0.4 mg total) by mouth at bedtime., Disp: 90 capsule, Rfl: 3 .  acetaminophen  (TYLENOL ) 500 mg tablet, Take 1,000 mg by mouth every 6 (six) hours as  needed Indications: pain associated with arthritis., Disp: , Rfl:  .  ipratropium-albuteroL  (DUO-NEB) 0.5-2.5 mg/3 mL nebulizer solution, Take 3 mL by nebulization every 6 (six) hours., Disp: 1080 mL, Rfl: 3 .  levoFLOXacin (LEVAQUIN) 500 mg tablet, Take 1 tablet (500 mg total) by mouth daily for 7 days., Disp: 7 tablet, Rfl: 0 .  methocarbamoL (ROBAXIN) 500 mg tablet, TAKE 1 TABLET (500 MG TOTAL) BY MOUTH NIGHTLY AS NEEDED. (Patient not taking: Reported on 03/13/2024), Disp: 30 tablet, Rfl: 0

## 2024-03-15 NOTE — Telephone Encounter (Signed)
 Live Answer: When patient returns call, please communicate the follow information:  Live Answer can communication the following information: Call patient/family. Chest xray shows left lower lobe pneumonia. He needs a follow up visit with Dr Thelbert next week.   Please be sure to make follow up appt with JRJ next week.       Called , [email or web address].

## 2024-03-19 ENCOUNTER — Emergency Department (HOSPITAL_BASED_OUTPATIENT_CLINIC_OR_DEPARTMENT_OTHER)

## 2024-03-19 ENCOUNTER — Other Ambulatory Visit: Payer: Self-pay

## 2024-03-19 ENCOUNTER — Encounter (HOSPITAL_BASED_OUTPATIENT_CLINIC_OR_DEPARTMENT_OTHER): Payer: Self-pay

## 2024-03-19 ENCOUNTER — Inpatient Hospital Stay (HOSPITAL_BASED_OUTPATIENT_CLINIC_OR_DEPARTMENT_OTHER)
Admission: EM | Admit: 2024-03-19 | Discharge: 2024-03-21 | DRG: 191 | Disposition: A | Discharge status: Home / Self Care | Attending: Internal Medicine | Admitting: Internal Medicine

## 2024-03-19 DIAGNOSIS — E871 Hypo-osmolality and hyponatremia: Principal | ICD-10-CM | POA: Diagnosis present

## 2024-03-19 DIAGNOSIS — Z87891 Personal history of nicotine dependence: Secondary | ICD-10-CM

## 2024-03-19 DIAGNOSIS — Z888 Allergy status to other drugs, medicaments and biological substances status: Secondary | ICD-10-CM

## 2024-03-19 DIAGNOSIS — D649 Anemia, unspecified: Secondary | ICD-10-CM | POA: Diagnosis present

## 2024-03-19 DIAGNOSIS — J441 Chronic obstructive pulmonary disease with (acute) exacerbation: Secondary | ICD-10-CM | POA: Diagnosis not present

## 2024-03-19 DIAGNOSIS — R0602 Shortness of breath: Secondary | ICD-10-CM

## 2024-03-19 DIAGNOSIS — Z7982 Long term (current) use of aspirin: Secondary | ICD-10-CM

## 2024-03-19 DIAGNOSIS — N1831 Chronic kidney disease, stage 3a: Secondary | ICD-10-CM | POA: Diagnosis present

## 2024-03-19 DIAGNOSIS — C8590 Non-Hodgkin lymphoma, unspecified, unspecified site: Secondary | ICD-10-CM | POA: Diagnosis present

## 2024-03-19 DIAGNOSIS — N400 Enlarged prostate without lower urinary tract symptoms: Secondary | ICD-10-CM | POA: Diagnosis present

## 2024-03-19 DIAGNOSIS — E222 Syndrome of inappropriate secretion of antidiuretic hormone: Secondary | ICD-10-CM | POA: Diagnosis present

## 2024-03-19 DIAGNOSIS — Z85528 Personal history of other malignant neoplasm of kidney: Secondary | ICD-10-CM

## 2024-03-19 DIAGNOSIS — F1090 Alcohol use, unspecified, uncomplicated: Secondary | ICD-10-CM | POA: Diagnosis present

## 2024-03-19 DIAGNOSIS — I252 Old myocardial infarction: Secondary | ICD-10-CM

## 2024-03-19 DIAGNOSIS — I2510 Atherosclerotic heart disease of native coronary artery without angina pectoris: Secondary | ICD-10-CM | POA: Diagnosis present

## 2024-03-19 DIAGNOSIS — Z8546 Personal history of malignant neoplasm of prostate: Secondary | ICD-10-CM

## 2024-03-19 DIAGNOSIS — I10 Essential (primary) hypertension: Secondary | ICD-10-CM | POA: Diagnosis present

## 2024-03-19 DIAGNOSIS — E861 Hypovolemia: Secondary | ICD-10-CM | POA: Diagnosis present

## 2024-03-19 DIAGNOSIS — Z981 Arthrodesis status: Secondary | ICD-10-CM

## 2024-03-19 DIAGNOSIS — I129 Hypertensive chronic kidney disease with stage 1 through stage 4 chronic kidney disease, or unspecified chronic kidney disease: Secondary | ICD-10-CM | POA: Diagnosis present

## 2024-03-19 DIAGNOSIS — E039 Hypothyroidism, unspecified: Secondary | ICD-10-CM | POA: Diagnosis present

## 2024-03-19 DIAGNOSIS — D631 Anemia in chronic kidney disease: Secondary | ICD-10-CM | POA: Diagnosis present

## 2024-03-19 DIAGNOSIS — Z905 Acquired absence of kidney: Secondary | ICD-10-CM

## 2024-03-19 DIAGNOSIS — Z885 Allergy status to narcotic agent status: Secondary | ICD-10-CM

## 2024-03-19 DIAGNOSIS — Z79899 Other long term (current) drug therapy: Secondary | ICD-10-CM

## 2024-03-19 HISTORY — DX: Non-Hodgkin lymphoma, unspecified, unspecified site: C85.90

## 2024-03-19 LAB — CBC WITH DIFFERENTIAL/PLATELET
Abs Immature Granulocytes: 0.06 K/uL (ref 0.00–0.07)
Basophils Absolute: 0 K/uL (ref 0.0–0.1)
Basophils Relative: 0 %
Eosinophils Absolute: 0 K/uL (ref 0.0–0.5)
Eosinophils Relative: 0 %
HCT: 29.8 % — ABNORMAL LOW (ref 39.0–52.0)
Hemoglobin: 10 g/dL — ABNORMAL LOW (ref 13.0–17.0)
Immature Granulocytes: 1 %
Lymphocytes Relative: 22 %
Lymphs Abs: 1.6 K/uL (ref 0.7–4.0)
MCH: 29.9 pg (ref 26.0–34.0)
MCHC: 33.6 g/dL (ref 30.0–36.0)
MCV: 89.2 fL (ref 80.0–100.0)
Monocytes Absolute: 0.8 K/uL (ref 0.1–1.0)
Monocytes Relative: 11 %
Neutro Abs: 4.8 K/uL (ref 1.7–7.7)
Neutrophils Relative %: 66 %
Platelets: 166 K/uL (ref 150–400)
RBC: 3.34 MIL/uL — ABNORMAL LOW (ref 4.22–5.81)
RDW: 15.4 % (ref 11.5–15.5)
WBC: 7.2 K/uL (ref 4.0–10.5)
nRBC: 0 % (ref 0.0–0.2)

## 2024-03-19 LAB — COMPREHENSIVE METABOLIC PANEL WITH GFR
ALT: 10 U/L (ref 0–44)
AST: 16 U/L (ref 15–41)
Albumin: 3.5 g/dL (ref 3.5–5.0)
Alkaline Phosphatase: 67 U/L (ref 38–126)
Anion gap: 13 (ref 5–15)
BUN: 17 mg/dL (ref 8–23)
CO2: 19 mmol/L — ABNORMAL LOW (ref 22–32)
Calcium: 9.6 mg/dL (ref 8.9–10.3)
Chloride: 92 mmol/L — ABNORMAL LOW (ref 98–111)
Creatinine, Ser: 1.23 mg/dL (ref 0.61–1.24)
GFR, Estimated: 56 mL/min — ABNORMAL LOW (ref >60)
Glucose, Bld: 111 mg/dL — ABNORMAL HIGH (ref 70–99)
Potassium: 4.7 mmol/L (ref 3.5–5.1)
Sodium: 123 mmol/L — ABNORMAL LOW (ref 135–145)
Total Bilirubin: 0.7 mg/dL (ref 0.0–1.2)
Total Protein: 8.8 g/dL — ABNORMAL HIGH (ref 6.5–8.1)

## 2024-03-19 LAB — URINALYSIS, ROUTINE W REFLEX MICROSCOPIC
Bilirubin Urine: NEGATIVE
Glucose, UA: NEGATIVE mg/dL
Ketones, ur: NEGATIVE mg/dL
Leukocytes,Ua: NEGATIVE
Nitrite: NEGATIVE
Protein, ur: 30 mg/dL — AB
Specific Gravity, Urine: 1.025 (ref 1.005–1.030)
pH: 6.5 (ref 5.0–8.0)

## 2024-03-19 LAB — URINALYSIS, MICROSCOPIC (REFLEX): WBC, UA: NONE SEEN WBC/hpf (ref 0–5)

## 2024-03-19 LAB — PRO BRAIN NATRIURETIC PEPTIDE: Pro Brain Natriuretic Peptide: 432 pg/mL — ABNORMAL HIGH (ref <300.0)

## 2024-03-19 LAB — RESP PANEL BY RT-PCR (RSV, FLU A&B, COVID)  RVPGX2
Influenza A by PCR: NEGATIVE
Influenza B by PCR: NEGATIVE
Resp Syncytial Virus by PCR: NEGATIVE
SARS Coronavirus 2 by RT PCR: NEGATIVE

## 2024-03-19 LAB — TROPONIN T, HIGH SENSITIVITY
Troponin T High Sensitivity: 18 ng/L (ref 0–19)
Troponin T High Sensitivity: 15 ng/L (ref 0–19)

## 2024-03-19 LAB — OSMOLALITY: Osmolality: 271 mosm/kg — ABNORMAL LOW (ref 275–295)

## 2024-03-19 MED ORDER — IPRATROPIUM-ALBUTEROL 0.5-2.5 (3) MG/3ML IN SOLN
3.0000 mL | Freq: Once | RESPIRATORY_TRACT | Status: AC
Start: 2024-03-19 — End: 2024-03-19
  Administered 2024-03-19: 3 mL via RESPIRATORY_TRACT
  Filled 2024-03-19: qty 3

## 2024-03-19 MED ORDER — ATORVASTATIN CALCIUM 40 MG PO TABS
40.0000 mg | ORAL_TABLET | Freq: Every day | ORAL | Status: DC
  Administered 2024-03-20 – 2024-03-21 (×2): 40 mg via ORAL
  Filled 2024-03-19 (×2): qty 1

## 2024-03-19 MED ORDER — METHYLPREDNISOLONE SODIUM SUCC 125 MG IJ SOLR
125.0000 mg | Freq: Every day | INTRAMUSCULAR | Status: DC
  Administered 2024-03-19 – 2024-03-20 (×2): 125 mg via INTRAVENOUS
  Filled 2024-03-19 (×2): qty 2

## 2024-03-19 MED ORDER — DOCUSATE SODIUM 100 MG PO CAPS: 100.0000 mg | ORAL_CAPSULE | Freq: Two times a day (BID) | ORAL | Status: DC | PRN

## 2024-03-19 MED ORDER — METOPROLOL TARTRATE 25 MG PO TABS
25.0000 mg | ORAL_TABLET | Freq: Every day | ORAL | Status: DC
  Administered 2024-03-20 – 2024-03-21 (×2): 25 mg via ORAL
  Filled 2024-03-19 (×2): qty 1

## 2024-03-19 MED ORDER — ASPIRIN 81 MG PO TBEC
81.0000 mg | DELAYED_RELEASE_TABLET | Freq: Every day | ORAL | Status: DC
  Administered 2024-03-20 – 2024-03-21 (×2): 81 mg via ORAL
  Filled 2024-03-19 (×2): qty 1

## 2024-03-19 MED ORDER — VITAMIN D 25 MCG (1000 UNIT) PO TABS
1000.0000 [IU] | ORAL_TABLET | Freq: Every day | ORAL | Status: DC
Start: 2024-03-20 — End: 2024-03-21
  Administered 2024-03-20 – 2024-03-21 (×2): 1000 [IU] via ORAL
  Filled 2024-03-19 (×2): qty 1

## 2024-03-19 MED ORDER — AMLODIPINE BESYLATE 5 MG PO TABS
5.0000 mg | ORAL_TABLET | Freq: Every day | ORAL | Status: DC
  Administered 2024-03-20 – 2024-03-21 (×2): 5 mg via ORAL
  Filled 2024-03-19 (×2): qty 1

## 2024-03-19 MED ORDER — IPRATROPIUM-ALBUTEROL 0.5-2.5 (3) MG/3ML IN SOLN
3.0000 mL | Freq: Four times a day (QID) | RESPIRATORY_TRACT | Status: DC | PRN
  Administered 2024-03-20: 3 mL via RESPIRATORY_TRACT
  Filled 2024-03-19: qty 3

## 2024-03-19 MED ORDER — SODIUM CHLORIDE 0.9 % IV SOLN: INTRAVENOUS | Status: DC

## 2024-03-19 MED ORDER — LOSARTAN POTASSIUM 50 MG PO TABS
100.0000 mg | ORAL_TABLET | Freq: Every day | ORAL | Status: DC
  Administered 2024-03-20 – 2024-03-21 (×2): 100 mg via ORAL
  Filled 2024-03-19: qty 4
  Filled 2024-03-19: qty 2

## 2024-03-19 MED ORDER — SODIUM CHLORIDE 0.9 % IV BOLUS
500.0000 mL | Freq: Once | INTRAVENOUS | Status: AC
  Administered 2024-03-19: 500 mL via INTRAVENOUS

## 2024-03-19 NOTE — ED Notes (Signed)
 Pt transferred from [facility] to ED RM 11. Assuming pt care at this time.   Family is overbearing and speaking over pt.

## 2024-03-19 NOTE — ED Triage Notes (Addendum)
 Pt has had pneumonia x 2 for a month Completed Levaquin today Still has a cough Lung sounds coarse rhonchi and wheezing Has been doing albuterol  tx TID Also states he is weak Hx of lymphoma

## 2024-03-19 NOTE — ED Notes (Signed)
 Called lab to run troponin level off of blood sample sent to lab previously. Per lab, they will put this into process now.

## 2024-03-19 NOTE — ED Notes (Signed)
 Pt transported to CT at this time.

## 2024-03-19 NOTE — ED Provider Notes (Signed)
 Craig Werner Provider Note   CSN: [id number] Arrival date & time: 03/19/24  1911     Patient presents with: Shortness of Breath   Craig Werner is a 89 y.o. male presenting to ED with continued shortness of breath, weakness, ongoing for now 1 month.  Patient was seen in the ED 1 month ago at that time for fatigue, shortness of breath.  Has a known history of COPD although does not wear oxygen at home.  Also has a history of MI.  His granddaughter reports that he was started on a course of steroids as well as antibiotics which she completed, with improvement of his respiratory symptoms.  Then he began to get sick again a few days ago, and went to the [facility] where he was prescribed another 7 days of Levaquin.  He is on his last day of Levaquin.  He continues to feel extremely fatigued, short of breath.  Family reports that his heart rate was jumping very high with walking and his oxygen was getting low.  The patient just keeps telling me he feels sick and bad.   HPI     Prior to Admission medications   Medication Sig Start Date End Date Taking? Authorizing Provider  acetaminophen  (TYLENOL ) 500 MG tablet Take 1,000 mg by mouth every 8 (eight) hours as needed (for pain.).     [provider]  albuterol  (VENTOLIN  HFA) 108 (90 Base) MCG/ACT inhaler Inhale 1-2 puffs into the lungs every 6 (six) hours as needed for wheezing or shortness of breath.    [provider]  albuterol  (VENTOLIN  HFA) 108 (90 Base) MCG/ACT inhaler Inhale 1-2 puffs into the lungs every 6 (six) hours as needed for wheezing or shortness of breath. 12/26/19   Debby Dorn MATSU, MD  amLODipine  (NORVASC ) 5 MG tablet Take 5 mg by mouth daily.    [provider]  aspirin EC 81 MG tablet Take 81 mg by mouth daily.    [provider]  atorvastatin  (LIPITOR) 40 MG tablet Take 40 mg by mouth daily.    [provider]  cholecalciferol   (VITAMIN D ) 25 MCG (1000 UNIT) tablet Take 1,000 Units by mouth daily.    [provider]  cyclobenzaprine  (FLEXERIL ) 10 MG tablet Take 1 tablet (10 mg total) by mouth 3 (three) times daily as needed for muscle spasms. 12/26/19   Debby Dorn MATSU, MD  docusate sodium  (COLACE) 100 MG capsule Take 100 mg by mouth 2 (two) times daily as needed for constipation. 10/14/19   [provider]  docusate sodium  (COLACE) 100 MG capsule Take 1 capsule (100 mg total) by mouth 2 (two) times daily. 12/26/19   Debby Dorn MATSU, MD  doxycycline  (VIBRAMYCIN ) 100 MG capsule Take 1 capsule (100 mg total) by mouth 2 (two) times daily. 02/13/24   Adele Song, MD  losartan  (COZAAR ) 100 MG tablet Take 100 mg by mouth daily.    [provider]  metoprolol  tartrate (LOPRESSOR ) 25 MG tablet Take 25 mg by mouth daily. 06/29/19   [provider]  sildenafil (VIAGRA) 100 MG tablet Take 100 mg by mouth daily as needed. 05/31/19   [provider]  Travoprost, BAK Free, (TRAVATAN) 0.004 % SOLN ophthalmic solution Place 1 drop into both eyes at bedtime.    [provider]    Allergies: Lisinopril, Promethazine, and Morphine    Review of Systems  Updated Vital Signs BP (!) 164/73   Pulse 67  Temp 98.2 F (36.8 C) (Oral)   Resp 15   Ht 5' 5 (1.651 m)   Wt 79.8 kg   SpO2 98%   BMI 29.29 kg/m   Physical Exam Constitutional:      General: He is not in acute distress. HENT:     Head: Normocephalic and atraumatic.  Eyes:     Conjunctiva/sclera: Conjunctivae normal.     Pupils: Pupils are equal, round, and reactive to light.  Cardiovascular:     Rate and Rhythm: Normal rate and regular rhythm.  Pulmonary:     Effort: Pulmonary effort is normal. No respiratory distress.     Comments: Expiratory wheezing, rhonchi, productive cough Abdominal:     General: There is no distension.     Tenderness: There is no abdominal tenderness.  Skin:    General: Skin is warm and  dry.  Neurological:     General: No focal deficit present.     Mental Status: He is alert. Mental status is at baseline.  Psychiatric:        Mood and Affect: Mood normal.        Behavior: Behavior normal.     (all labs ordered are listed, but only abnormal results are displayed) Labs Reviewed  CBC WITH DIFFERENTIAL/PLATELET - Abnormal; Notable for the following components:      Result Value   RBC 3.34 (*)    Hemoglobin 10.0 (*)    HCT 29.8 (*)    All other components within normal limits  COMPREHENSIVE METABOLIC PANEL WITH GFR - Abnormal; Notable for the following components:   Sodium 123 (*)    Chloride 92 (*)    CO2 19 (*)    Glucose, Bld 111 (*)    Total Protein 8.8 (*)    GFR, Estimated 56 (*)    All other components within normal limits  PRO BRAIN NATRIURETIC PEPTIDE - Abnormal; Notable for the following components:   Pro Brain Natriuretic Peptide 432.0 (*)    All other components within normal limits  URINALYSIS, ROUTINE W REFLEX MICROSCOPIC - Abnormal; Notable for the following components:   Hgb urine dipstick TRACE (*)    Protein, ur 30 (*)    All other components within normal limits  URINALYSIS, MICROSCOPIC (REFLEX) - Abnormal; Notable for the following components:   Bacteria, UA RARE (*)    All other components within normal limits  RESP PANEL BY RT-PCR (RSV, FLU A&B, COVID)  [id number]  OSMOLALITY  TROPONIN T, HIGH SENSITIVITY  TROPONIN T, HIGH SENSITIVITY    EKG: EKG Interpretation Date/Time:  Tuesday March 19 2024 19:40:15 EDT Ventricular Rate:  81 PR Interval:  210 QRS Duration:  84 QT Interval:  378 QTC Calculation: 439 R Axis:   10  Text Interpretation: Sinus rhythm Low voltage, precordial leads Confirmed by Cottie Cough [phone]) on 03/19/2024 8:48:05 PM  Radiology: CT Chest Wo Contrast Result Date: 03/19/2024 CLINICAL DATA:  Persistent cough and weakness EXAM: CT CHEST WITHOUT CONTRAST TECHNIQUE: Multidetector CT imaging of the chest was  performed following the standard protocol without IV contrast. RADIATION DOSE REDUCTION: This exam was performed according to the departmental dose-optimization program which includes automated exposure control, adjustment of the mA and/or kV according to patient size and/or use of iterative reconstruction technique. COMPARISON:  Chest x-ray 03/19/2024, PET CT 12/18/2023, chest CT 10/19/2015 FINDINGS: Cardiovascular: Limited evaluation without intravenous contrast. Moderate severe aortic atherosclerosis. No aneurysm. Multi vessel coronary vascular calcification. Normal cardiac size. No pericardial effusion Mediastinum/Nodes: Patent trachea. Left  thyroid nodule measuring 2 cm. This has been evaluated on previous imaging. (ref: J Am Coll Radiol. 2015 Feb;12(2): 143-50). No suspicious lymph nodes. Esophagus within normal limits Lungs/Pleura: Elevation of the right diaphragm. No pleural effusion or pneumothorax. Mild subpleural reticulation. Mild bilateral bronchiectasis. Bilateral bronchial wall thickening. No focal airspace disease. Small pulmonary nodules, largest in the left upper lobe measuring 5 mm on series 3, image 82, stable compared to prior, no specific imaging follow-up is recommended Upper Abdomen: Gallstones.  No acute finding Musculoskeletal: No acute osseous abnormality IMPRESSION: 1. No CT evidence for acute intrathoracic abnormality. 2. Mild subpleural reticulation consistent with mild scarring. Mild bronchiectasis. Bilateral bronchial wall thickening suggesting airways inflammatory process, but no focal airspace disease. 3. Gallstones. 4. Aortic atherosclerosis. Aortic Atherosclerosis (ICD10-I70.0). Electronically Signed   By: Luke Bun M.D.   On: 03/19/2024 22:23   DG Chest Portable 1 View Result Date: 03/19/2024 CLINICAL DATA:  Shortness of breath EXAM: PORTABLE CHEST 1 VIEW COMPARISON:  02/13/2024 FINDINGS: Cardiac shadow is stable. Aortic calcifications are noted. The lungs are well  aerated bilaterally. No focal infiltrate or effusion is seen. Postsurgical changes are noted in the cervical spine. No acute bony abnormality is noted. IMPRESSION: No active disease. Electronically Signed   By: Oneil Devonshire M.D.   On: 03/19/2024 20:46     Procedures   Medications Ordered in the ED  methylPREDNISolone  sodium succinate (SOLU-MEDROL ) 125 mg/2 mL injection 125 mg (125 mg Intravenous Given 03/19/24 2119)  ipratropium-albuterol  (DUONEB) 0.5-2.5 (3) MG/3ML nebulizer solution 3 mL (3 mLs Nebulization Given 03/19/24 2138)  sodium chloride  0.9 % bolus 500 mL (0 mLs Intravenous Stopped 03/19/24 2242)                                    Medical Decision Making Amount and/or Complexity of Data Reviewed Labs: ordered. Radiology: ordered.  Risk Prescription drug management. Decision regarding hospitalization.   This patient presents to the ED with concern for shortness of breath. This involves an extensive number of treatment options, and is a complaint that carries with it a high risk of complications and morbidity.  The differential diagnosis includes recurring pneumonia versus pleural effusion versus anemia versus pneumothorax versus congestive heart failure versus bronchitis versus COPD versus other  Co-morbidities that complicate the patient evaluation: COPD  Additional history obtained from family members at bedside  External records from outside source obtained and reviewed including prior hospitalization records  I ordered and personally interpreted labs.  The pertinent results include: Hyponatremia with sodium 123.  White blood cell count normal.  BNP 400s.  COVID flu negative  I ordered imaging studies including x-ray of the chest, CT of the chest I independently visualized and interpreted imaging which showed chronic scarring consistent with bronchitis, no acute infiltrate I agree with the radiologist interpretation  The patient was maintained on a cardiac monitor.   I personally viewed and interpreted the cardiac monitored which showed an underlying rhythm of: Sinus rhythm  Per my interpretation the patient's ECG shows no acute ischemic finding  I ordered medication including saline for hyponatremia, IV steroids and DuoNeb for suspected COPD exacerbation  I have reviewed the patients home medicines and have made adjustments as needed  Test Considered: Lower suspicion for acute PE in this clinical setting.   After the interventions noted above, I reevaluated the patient and found that they have: improved   Dispostion:  After consideration  of the diagnostic results and the patients response to treatment, I feel that the patent would benefit from medical admission.      Final diagnoses:  Hyponatremia  COPD exacerbation (HCC)  Shortness of breath    ED Discharge Orders     None          Cottie Donnice PARAS, MD 03/19/24 2304

## 2024-03-19 NOTE — ED Notes (Signed)
 Pt aware of necessity of urine sample.

## 2024-03-20 DIAGNOSIS — I2510 Atherosclerotic heart disease of native coronary artery without angina pectoris: Secondary | ICD-10-CM | POA: Diagnosis present

## 2024-03-20 DIAGNOSIS — Z8546 Personal history of malignant neoplasm of prostate: Secondary | ICD-10-CM | POA: Diagnosis not present

## 2024-03-20 DIAGNOSIS — C8590 Non-Hodgkin lymphoma, unspecified, unspecified site: Secondary | ICD-10-CM | POA: Diagnosis present

## 2024-03-20 DIAGNOSIS — E861 Hypovolemia: Secondary | ICD-10-CM | POA: Diagnosis present

## 2024-03-20 DIAGNOSIS — Z981 Arthrodesis status: Secondary | ICD-10-CM | POA: Diagnosis not present

## 2024-03-20 DIAGNOSIS — Z79899 Other long term (current) drug therapy: Secondary | ICD-10-CM | POA: Diagnosis not present

## 2024-03-20 DIAGNOSIS — J441 Chronic obstructive pulmonary disease with (acute) exacerbation: Secondary | ICD-10-CM | POA: Diagnosis present

## 2024-03-20 DIAGNOSIS — Z885 Allergy status to narcotic agent status: Secondary | ICD-10-CM | POA: Diagnosis not present

## 2024-03-20 DIAGNOSIS — N400 Enlarged prostate without lower urinary tract symptoms: Secondary | ICD-10-CM | POA: Diagnosis present

## 2024-03-20 DIAGNOSIS — N1831 Chronic kidney disease, stage 3a: Secondary | ICD-10-CM | POA: Diagnosis present

## 2024-03-20 DIAGNOSIS — Z87891 Personal history of nicotine dependence: Secondary | ICD-10-CM | POA: Diagnosis not present

## 2024-03-20 DIAGNOSIS — I129 Hypertensive chronic kidney disease with stage 1 through stage 4 chronic kidney disease, or unspecified chronic kidney disease: Secondary | ICD-10-CM | POA: Diagnosis present

## 2024-03-20 DIAGNOSIS — Z888 Allergy status to other drugs, medicaments and biological substances status: Secondary | ICD-10-CM | POA: Diagnosis not present

## 2024-03-20 DIAGNOSIS — R0602 Shortness of breath: Secondary | ICD-10-CM | POA: Diagnosis present

## 2024-03-20 DIAGNOSIS — D631 Anemia in chronic kidney disease: Secondary | ICD-10-CM | POA: Diagnosis present

## 2024-03-20 DIAGNOSIS — I252 Old myocardial infarction: Secondary | ICD-10-CM | POA: Diagnosis not present

## 2024-03-20 DIAGNOSIS — E222 Syndrome of inappropriate secretion of antidiuretic hormone: Secondary | ICD-10-CM | POA: Diagnosis present

## 2024-03-20 DIAGNOSIS — Z85528 Personal history of other malignant neoplasm of kidney: Secondary | ICD-10-CM | POA: Diagnosis not present

## 2024-03-20 DIAGNOSIS — Z7982 Long term (current) use of aspirin: Secondary | ICD-10-CM | POA: Diagnosis not present

## 2024-03-20 DIAGNOSIS — Z905 Acquired absence of kidney: Secondary | ICD-10-CM | POA: Diagnosis not present

## 2024-03-20 DIAGNOSIS — E039 Hypothyroidism, unspecified: Secondary | ICD-10-CM | POA: Diagnosis present

## 2024-03-20 LAB — BASIC METABOLIC PANEL WITH GFR
Anion gap: 12 (ref 5–15)
Anion gap: 10 (ref 5–15)
BUN: 16 mg/dL (ref 8–23)
BUN: 21 mg/dL (ref 8–23)
CO2: 17 mmol/L — ABNORMAL LOW (ref 22–32)
CO2: 19 mmol/L — ABNORMAL LOW (ref 22–32)
Calcium: 9 mg/dL (ref 8.9–10.3)
Calcium: 9 mg/dL (ref 8.9–10.3)
Chloride: 95 mmol/L — ABNORMAL LOW (ref 98–111)
Chloride: 95 mmol/L — ABNORMAL LOW (ref 98–111)
Creatinine, Ser: 1.22 mg/dL (ref 0.61–1.24)
Creatinine, Ser: 1.34 mg/dL — ABNORMAL HIGH (ref 0.61–1.24)
GFR, Estimated: 57 mL/min — ABNORMAL LOW (ref >60)
GFR, Estimated: 51 mL/min — ABNORMAL LOW (ref >60)
Glucose, Bld: 125 mg/dL — ABNORMAL HIGH (ref 70–99)
Glucose, Bld: 156 mg/dL — ABNORMAL HIGH (ref 70–99)
Potassium: 4.6 mmol/L (ref 3.5–5.1)
Potassium: 4.3 mmol/L (ref 3.5–5.1)
Sodium: 124 mmol/L — ABNORMAL LOW (ref 135–145)
Sodium: 124 mmol/L — ABNORMAL LOW (ref 135–145)

## 2024-03-20 LAB — OSMOLALITY: Osmolality: 271 mosm/kg — ABNORMAL LOW (ref 275–295)

## 2024-03-20 LAB — TSH: TSH: 0.801 u[IU]/mL (ref 0.350–4.500)

## 2024-03-20 MED ORDER — THIAMINE MONONITRATE 100 MG PO TABS
100.0000 mg | ORAL_TABLET | Freq: Every day | ORAL | Status: DC
  Administered 2024-03-20 – 2024-03-21 (×2): 100 mg via ORAL
  Filled 2024-03-20 (×2): qty 1

## 2024-03-20 MED ORDER — FUROSEMIDE 10 MG/ML IJ SOLN: 20.0000 mg | Freq: Once | INTRAMUSCULAR | Status: DC

## 2024-03-20 MED ORDER — THIAMINE HCL 100 MG/ML IJ SOLN
100.0000 mg | Freq: Every day | INTRAMUSCULAR | Status: DC
  Filled 2024-03-20: qty 2

## 2024-03-20 MED ORDER — ENOXAPARIN SODIUM 40 MG/0.4ML IJ SOSY
40.0000 mg | PREFILLED_SYRINGE | INTRAMUSCULAR | Status: DC
  Administered 2024-03-20: 40 mg via SUBCUTANEOUS
  Filled 2024-03-20: qty 0.4

## 2024-03-20 MED ORDER — METHYLPREDNISOLONE SODIUM SUCC 40 MG IJ SOLR: 40.0000 mg | Freq: Two times a day (BID) | INTRAMUSCULAR | Status: DC

## 2024-03-20 MED ORDER — LORAZEPAM 2 MG/ML IJ SOLN: 1.0000 mg | INTRAMUSCULAR | Status: DC | PRN

## 2024-03-20 MED ORDER — LORAZEPAM 1 MG PO TABS: 1.0000 mg | ORAL_TABLET | ORAL | Status: DC | PRN

## 2024-03-20 MED ORDER — ALBUTEROL SULFATE (2.5 MG/3ML) 0.083% IN NEBU
2.5000 mg | INHALATION_SOLUTION | RESPIRATORY_TRACT | Status: DC | PRN
  Administered 2024-03-21: 2.5 mg via RESPIRATORY_TRACT
  Filled 2024-03-20: qty 3

## 2024-03-20 MED ORDER — ACETAMINOPHEN 650 MG RE SUPP: 650.0000 mg | Freq: Four times a day (QID) | RECTAL | Status: DC | PRN

## 2024-03-20 MED ORDER — FOLIC ACID 1 MG PO TABS
1.0000 mg | ORAL_TABLET | Freq: Every day | ORAL | Status: DC
  Administered 2024-03-20 – 2024-03-21 (×2): 1 mg via ORAL
  Filled 2024-03-20 (×2): qty 1

## 2024-03-20 MED ORDER — ONDANSETRON HCL 4 MG PO TABS: 4.0000 mg | ORAL_TABLET | Freq: Four times a day (QID) | ORAL | Status: DC | PRN

## 2024-03-20 MED ORDER — ONDANSETRON HCL 4 MG/2ML IJ SOLN: 4.0000 mg | Freq: Four times a day (QID) | INTRAMUSCULAR | Status: DC | PRN

## 2024-03-20 MED ORDER — ADULT MULTIVITAMIN W/MINERALS CH
1.0000 | ORAL_TABLET | Freq: Every day | ORAL | Status: DC
  Administered 2024-03-20 – 2024-03-21 (×2): 1 via ORAL
  Filled 2024-03-20 (×2): qty 1

## 2024-03-20 MED ORDER — TRAZODONE HCL 50 MG PO TABS
25.0000 mg | ORAL_TABLET | Freq: Every evening | ORAL | Status: DC | PRN
Start: 2024-03-20 — End: 2024-03-21

## 2024-03-20 MED ORDER — METHYLPREDNISOLONE SODIUM SUCC 40 MG IJ SOLR
40.0000 mg | Freq: Two times a day (BID) | INTRAMUSCULAR | Status: DC
Start: 2024-03-20 — End: 2024-03-21
  Administered 2024-03-20 – 2024-03-21 (×2): 40 mg via INTRAVENOUS
  Filled 2024-03-20 (×2): qty 1

## 2024-03-20 MED ORDER — IPRATROPIUM-ALBUTEROL 0.5-2.5 (3) MG/3ML IN SOLN
3.0000 mL | Freq: Four times a day (QID) | RESPIRATORY_TRACT | Status: DC
  Administered 2024-03-20 – 2024-03-21 (×3): 3 mL via RESPIRATORY_TRACT
  Filled 2024-03-20 (×3): qty 3

## 2024-03-20 MED ORDER — ACETAMINOPHEN 325 MG PO TABS: 650.0000 mg | ORAL_TABLET | Freq: Four times a day (QID) | ORAL | Status: DC | PRN

## 2024-03-20 NOTE — ED Notes (Signed)
 Called [facility] will work on placement

## 2024-03-20 NOTE — Plan of Care (Signed)

## 2024-03-20 NOTE — H&P (Addendum)
 History and Physical  ILIAN Werner FMW:[id number] DOB: [birth year] DOA: 03/19/2024  PCP: Thelbert Eva SAUNDERS, DO   Chief Complaint: Cough, wheezing  HPI: Craig Werner is a 89 y.o. male with medical history significant for non-Hodgkin's lymphoma not on treatment, CAD, hypertension, CKD, COPD on room air being admitted to the hospital with cough, wheezing likely due to acute exacerbation of COPD.  History is provided mainly by the patient's 2 daughters who are at the bedside and who live next-door.  They state that he developed cough wheezing and sputum production last month, was diagnosed with community-acquired pneumonia and treated with doxycycline  starting on 9/16.  He seemed to have recovered completely and was doing well until about a week ago, when they noticed that he had some generalized malaise, body aches, nonproductive cough.  Noticed progressive wheezing and shortness of breath with ambulation over the last few days as well.  Last week when he started having the symptoms, he was seen by his PCP who prescribed Levaquin and he took his last dose of this antibiotic yesterday.  Denies any chest pain, fevers, or significant sputum production.  He has not been feeling well, so has been eating and drinking very little has been feeling dehydrated.  Workup in the emergency department showed evidence of COPD exacerbation, as well as hyponatremia.  He was placed on IV fluids and admitted to the hospitalist service.  Review of Systems: Please see HPI for pertinent positives and negatives. A complete 10 system review of systems are otherwise negative.  Past Medical History:  Diagnosis Date   Arthritis    Asthma    Cancer (HCC)    prostate cancer; kidney cancer   Chronic kidney disease    kidney cancer; Left kidney removed   Coronary artery disease    Hypertension    Myocardial infarction (HCC)    x 2    Non Hodgkin's lymphoma (HCC)    Pneumonia    Past Surgical History:  Procedure Laterality  Date   APPENDECTOMY     CARDIAC CATHETERIZATION     x 3 stents   EYE SURGERY Right    right eye sx.   JOINT REPLACEMENT Right    3 joint replacements   LUMBAR LAMINECTOMY/DECOMPRESSION MICRODISCECTOMY N/A 08/15/2019   Procedure: Lumbar four-five Laminectomy/Foraminotomy;  Surgeon: Debby Dorn MATSU, MD;  Location: [facility] OR;  Service: Neurosurgery;  Laterality: N/A;   NEPHRECTOMY Left    POSTERIOR CERVICAL FUSION/FORAMINOTOMY N/A 12/25/2019   Procedure: POSTERIOR CERVICAL DECOMPRESSION AND FUSION CERVICAL 7- THORACIC 1;  Surgeon: Debby Dorn MATSU, MD;  Location: [facility] OR;  Service: Neurosurgery;  Laterality: N/A;   TONSILLECTOMY     Social History:  reports that he has quit smoking. He has never used smokeless tobacco. He reports current alcohol use. He reports that he does not use drugs.  Allergies  Allergen Reactions   Lisinopril Cough   Promethazine Other (See Comments)    Hallucinations     Morphine Itching         History reviewed. No pertinent family history.   Prior to Admission medications   Medication Sig Start Date End Date Taking? Authorizing Provider  acetaminophen  (TYLENOL ) 500 MG tablet Take 1,000 mg by mouth every 8 (eight) hours as needed (for pain.).     [provider]  albuterol  (VENTOLIN  HFA) 108 (90 Base) MCG/ACT inhaler Inhale 1-2 puffs into the lungs every 6 (six) hours as needed for wheezing or shortness of breath.    [provider]  albuterol  (VENTOLIN  HFA) 108 (90 Base) MCG/ACT inhaler Inhale 1-2 puffs into the lungs every 6 (six) hours as needed for wheezing or shortness of breath. 12/26/19   Debby Dorn MATSU, MD  amLODipine  (NORVASC ) 5 MG tablet Take 5 mg by mouth daily.    [provider]  aspirin EC 81 MG tablet Take 81 mg by mouth daily.    [provider]  atorvastatin  (LIPITOR) 40 MG tablet Take 40 mg by mouth daily.    [provider]  cholecalciferol  (VITAMIN D ) 25 MCG (1000 UNIT) tablet Take 1,000  Units by mouth daily.    [provider]  cyclobenzaprine  (FLEXERIL ) 10 MG tablet Take 1 tablet (10 mg total) by mouth 3 (three) times daily as needed for muscle spasms. 12/26/19   Debby Dorn MATSU, MD  docusate sodium  (COLACE) 100 MG capsule Take 100 mg by mouth 2 (two) times daily as needed for constipation. 10/14/19   [provider]  doxycycline  (VIBRAMYCIN ) 100 MG capsule Take 1 capsule (100 mg total) by mouth 2 (two) times daily. 02/13/24   Adele Song, MD  losartan  (COZAAR ) 100 MG tablet Take 100 mg by mouth daily.    [provider]  metoprolol  tartrate (LOPRESSOR ) 25 MG tablet Take 25 mg by mouth daily. 06/29/19   [provider]  sildenafil (VIAGRA) 100 MG tablet Take 100 mg by mouth daily as needed. 05/31/19   [provider]  Travoprost, BAK Free, (TRAVATAN) 0.004 % SOLN ophthalmic solution Place 1 drop into both eyes at bedtime.    [provider]    Physical Exam: BP 131/74 (BP Location: Right Arm)   Pulse 67   Temp 98.5 F (36.9 C)   Resp 18   Ht 5' 5 (1.651 m)   Wt 79.8 kg   SpO2 95%   BMI 29.29 kg/m  General:  Alert, oriented, calm, in no acute distress, multiple family members including his wife and 2 daughters at the bedside.  No cough, patient is speaking in full sentences.  Looks euvolemic versus marginally fluid overloaded. Cardiovascular: RRR, no murmurs or rubs, trace peripheral edema  Respiratory: Breath sounds are globally distant, with some rhonchi, mild end expiratory wheezing. Abdomen: soft, nontender, nondistended, normal bowel tones heard  Skin: dry, no rashes  Musculoskeletal: no joint effusions, normal range of motion  Psychiatric: appropriate affect, normal speech  Neurologic: extraocular muscles intact, clear speech, moving all extremities with intact sensorium         Labs on Admission:  Basic Metabolic Panel: Recent Labs  Lab 03/19/24 1953  NA 123*  K 4.7  CL 92*  CO2 19*  GLUCOSE 111*   BUN 17  CREATININE 1.23  CALCIUM  9.6   Liver Function Tests: Recent Labs  Lab 03/19/24 1953  AST 16  ALT 10  ALKPHOS 67  BILITOT 0.7  PROT 8.8*  ALBUMIN 3.5   No results for input(s): LIPASE, AMYLASE in the last 168 hours. No results for input(s): AMMONIA in the last 168 hours. CBC: Recent Labs  Lab 03/19/24 1953  WBC 7.2  NEUTROABS 4.8  HGB 10.0*  HCT 29.8*  MCV 89.2  PLT 166   Cardiac Enzymes: No results for input(s): CKTOTAL, CKMB, CKMBINDEX, TROPONINI in the last 168 hours. BNP (last 3 results) No results for input(s): BNP in the last 8760 hours.  ProBNP (last 3 results) Recent Labs    03/19/24 1953  PROBNP 432.0*    CBG: No results for input(s): GLUCAP in  the last 168 hours.  Radiological Exams on Admission: CT Chest Wo Contrast Result Date: 03/19/2024 CLINICAL DATA:  Persistent cough and weakness EXAM: CT CHEST WITHOUT CONTRAST TECHNIQUE: Multidetector CT imaging of the chest was performed following the standard protocol without IV contrast. RADIATION DOSE REDUCTION: This exam was performed according to the departmental dose-optimization program which includes automated exposure control, adjustment of the mA and/or kV according to patient size and/or use of iterative reconstruction technique. COMPARISON:  Chest x-ray 03/19/2024, PET CT 12/18/2023, chest CT 10/19/2015 FINDINGS: Cardiovascular: Limited evaluation without intravenous contrast. Moderate severe aortic atherosclerosis. No aneurysm. Multi vessel coronary vascular calcification. Normal cardiac size. No pericardial effusion Mediastinum/Nodes: Patent trachea. Left thyroid nodule measuring 2 cm. This has been evaluated on previous imaging. (ref: J Am Coll Radiol. 2015 Feb;12(2): 143-50). No suspicious lymph nodes. Esophagus within normal limits Lungs/Pleura: Elevation of the right diaphragm. No pleural effusion or pneumothorax. Mild subpleural reticulation. Mild bilateral bronchiectasis.  Bilateral bronchial wall thickening. No focal airspace disease. Small pulmonary nodules, largest in the left upper lobe measuring 5 mm on series 3, image 82, stable compared to prior, no specific imaging follow-up is recommended Upper Abdomen: Gallstones.  No acute finding Musculoskeletal: No acute osseous abnormality IMPRESSION: 1. No CT evidence for acute intrathoracic abnormality. 2. Mild subpleural reticulation consistent with mild scarring. Mild bronchiectasis. Bilateral bronchial wall thickening suggesting airways inflammatory process, but no focal airspace disease. 3. Gallstones. 4. Aortic atherosclerosis. Aortic Atherosclerosis (ICD10-I70.0). Electronically Signed   By: Luke Bun M.D.   On: 03/19/2024 22:23   DG Chest Portable 1 View Result Date: 03/19/2024 CLINICAL DATA:  Shortness of breath EXAM: PORTABLE CHEST 1 VIEW COMPARISON:  02/13/2024 FINDINGS: Cardiac shadow is stable. Aortic calcifications are noted. The lungs are well aerated bilaterally. No focal infiltrate or effusion is seen. Postsurgical changes are noted in the cervical spine. No acute bony abnormality is noted. IMPRESSION: No active disease. Electronically Signed   By: Oneil Devonshire M.D.   On: 03/19/2024 20:46   Assessment/Plan Craig Werner is a 89 y.o. male with medical history significant for non-Hodgkin's lymphoma not on treatment, CAD, hypertension, CKD, COPD on room air being admitted to the hospital with cough, wheezing likely due to acute exacerbation of COPD.  Acute exacerbation of COPD-without objective evidence of recurrent acute infection.  Chest x-ray is nonacute, respiratory virus panel is negative.  He just completed a course of oral Levaquin yesterday. -Inpatient admission -Monitor for hypoxia -Scheduled DuoNebs, as needed albuterol  -Schedule IV Solu-Medrol  twice daily -Incentive spirometer, flutter valve  Hyponatremia-patient's family states he does have a history of hyponatremia but they are unsure of  his baseline sodium.  I suspect he likely has some hypovolemic hyponatremia from poor oral intake, could also have SIADH related to his recent pulmonary infection. -Check stat BMP now -Urine and serum sodium and osmolality studies as well as fluid restriction if not improved -Check TSH  CAD and hypertension-amlodipine , metoprolol   Daily alcohol use-patient reportedly drinks 2-3 beers every night -Thiamine, folate, multivitamin -P.o. Ativan per CIWA protocol  Non-Hodgkin lymphoma-patient and family have declined treatment  Normocytic anemia-appears to be stable, no evidence of active bleeding.  Will monitor in house and can be followed as an outpatient  DVT prophylaxis: Lovenox      Code Status: Full Code  Consults called: None  Admission status: The appropriate patient status for this patient is INPATIENT. Inpatient status is judged to be reasonable and necessary in order to provide the required intensity of service to  ensure the patient's safety. The patient's presenting symptoms, physical exam findings, and initial radiographic and laboratory data in the context of their chronic comorbidities is felt to place them at high risk for further clinical deterioration. Furthermore, it is not anticipated that the patient will be medically stable for discharge from the hospital within 2 midnights of admission.    I certify that at the point of admission it is my clinical judgment that the patient will require inpatient hospital care spanning beyond 2 midnights from the point of admission due to high intensity of service, high risk for further deterioration and high frequency of surveillance required  Time spent: 53 minutes  Chares Slaymaker CHRISTELLA Gail MD [facility] Pager [phone]  If 7PM-7AM, please contact night-coverage [email or web address] Password [email or web address]  03/20/2024, 3:04 PM

## 2024-03-21 ENCOUNTER — Inpatient Hospital Stay (HOSPITAL_COMMUNITY)

## 2024-03-21 DIAGNOSIS — N400 Enlarged prostate without lower urinary tract symptoms: Secondary | ICD-10-CM | POA: Diagnosis present

## 2024-03-21 DIAGNOSIS — E871 Hypo-osmolality and hyponatremia: Secondary | ICD-10-CM | POA: Diagnosis present

## 2024-03-21 DIAGNOSIS — E039 Hypothyroidism, unspecified: Secondary | ICD-10-CM | POA: Diagnosis present

## 2024-03-21 DIAGNOSIS — I10 Essential (primary) hypertension: Secondary | ICD-10-CM | POA: Diagnosis present

## 2024-03-21 DIAGNOSIS — N1831 Chronic kidney disease, stage 3a: Secondary | ICD-10-CM | POA: Diagnosis present

## 2024-03-21 DIAGNOSIS — J441 Chronic obstructive pulmonary disease with (acute) exacerbation: Secondary | ICD-10-CM | POA: Diagnosis not present

## 2024-03-21 DIAGNOSIS — C8590 Non-Hodgkin lymphoma, unspecified, unspecified site: Secondary | ICD-10-CM | POA: Diagnosis present

## 2024-03-21 DIAGNOSIS — D649 Anemia, unspecified: Secondary | ICD-10-CM | POA: Diagnosis present

## 2024-03-21 DIAGNOSIS — F1090 Alcohol use, unspecified, uncomplicated: Secondary | ICD-10-CM | POA: Diagnosis present

## 2024-03-21 DIAGNOSIS — I2510 Atherosclerotic heart disease of native coronary artery without angina pectoris: Secondary | ICD-10-CM | POA: Diagnosis present

## 2024-03-21 LAB — BASIC METABOLIC PANEL WITH GFR
Anion gap: 10 (ref 5–15)
BUN: 20 mg/dL (ref 8–23)
CO2: 19 mmol/L — ABNORMAL LOW (ref 22–32)
Calcium: 9.3 mg/dL (ref 8.9–10.3)
Chloride: 98 mmol/L (ref 98–111)
Creatinine, Ser: 1.26 mg/dL — ABNORMAL HIGH (ref 0.61–1.24)
GFR, Estimated: 55 mL/min — ABNORMAL LOW (ref >60)
Glucose, Bld: 133 mg/dL — ABNORMAL HIGH (ref 70–99)
Potassium: 4.6 mmol/L (ref 3.5–5.1)
Sodium: 127 mmol/L — ABNORMAL LOW (ref 135–145)

## 2024-03-21 LAB — CBC
HCT: 28.7 % — ABNORMAL LOW (ref 39.0–52.0)
Hemoglobin: 9.2 g/dL — ABNORMAL LOW (ref 13.0–17.0)
MCH: 29.3 pg (ref 26.0–34.0)
MCHC: 32.1 g/dL (ref 30.0–36.0)
MCV: 91.4 fL (ref 80.0–100.0)
Platelets: 144 K/uL — ABNORMAL LOW (ref 150–400)
RBC: 3.14 MIL/uL — ABNORMAL LOW (ref 4.22–5.81)
RDW: 15.3 % (ref 11.5–15.5)
WBC: 7.8 K/uL (ref 4.0–10.5)
nRBC: 0 % (ref 0.0–0.2)

## 2024-03-21 MED ORDER — IPRATROPIUM-ALBUTEROL 0.5-2.5 (3) MG/3ML IN SOLN: 3.0000 mL | Freq: Three times a day (TID) | RESPIRATORY_TRACT | Status: DC

## 2024-03-21 MED ORDER — PREDNISONE 20 MG PO TABS: 40.0000 mg | ORAL_TABLET | Freq: Every day | ORAL | 0 refills | Status: AC

## 2024-03-21 NOTE — Discharge Summary (Addendum)
 Physician Discharge Summary   Patient: Craig Werner MRN: [id number] DOB: [birth year]  Admit date:     03/19/2024  Discharge date: 03/21/24  Discharge Physician: Delon Herald   PCP: Thelbert Eva SAUNDERS, DO   Recommendations at discharge:   Starting tomorrow,take prednisone  daily for 3 more days Continue Duonebs three times a day and Albuterol  breathing treatments every 2 hours as needed for wheezing, cough, or shortness of breath Follow up with Dr. Thelbert in 1-2 weeks You are being referred for outpatient nutrition evaluation  Discharge Diagnoses: Principal Problem:   COPD exacerbation (HCC) Active Problems:   Chronic hyponatremia   Chronic kidney disease, stage 3a (HCC)   CAD (coronary artery disease)   Essential hypertension   Alcohol use   NHL (non-Hodgkin's lymphoma) (HCC)   Normocytic anemia   Hypothyroidism   BPH (benign prostatic hyperplasia)    Hospital Course: 89yo with h/o NHL not on treatment, CAD, HTN, stage 3a CKD, ETOH use d/o, and COPD not on home O2 who presented with cough and wheezing.  He was diagnosed with COPD exacerbation and started on steroids and nebs.  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 with chronic daily drinking with likely superimposed hypovolemic hyponatremia from poor oral intake Improved today Recommend outpatient f/u  Stage 3a CKD Appears to be stable at this time Attempt to avoid nephrotoxic medications   CAD/Hypertension Continue metoprolol , losartan  Continue ASA, atorvastatin  No longer on  amlodipine    Daily alcohol use Patient reportedly drinks 2-3 beers every night No obvious withdrawal while hospitalized   Non-Hodgkin's lymphoma Patient and family have declined treatment   Normocytic anemia Appears to be stable No evidence of active bleeding  Hypothyroidism Normal TSH Continue Synthroid at current dose for now   BPH Continue tamsulosin and finasteride        Consultants: None   Procedures: None   Antibiotics: None    Pain control - [place]  Controlled Substance Reporting System database was reviewed. and patient was instructed, not to drive, operate heavy machinery, perform activities at heights, swimming or participation in water activities or provide baby-sitting services while on Pain, Sleep and Anxiety Medications; until their outpatient Physician has advised to do so again. Also recommended to not to take more than prescribed Pain, Sleep and Anxiety Medications.   Disposition: Home Diet recommendation:  Cardiac diet DISCHARGE MEDICATION: Allergies as of 03/21/2024       Reactions   Lisinopril Cough   Promethazine Other (See Comments)   Hallucinations   Morphine Itching           Medication List     STOP taking these medications    doxycycline  100 MG capsule Commonly known as: VIBRAMYCIN    levofloxacin 500 MG tablet Commonly known as: LEVAQUIN   methocarbamol 500 MG tablet Commonly known as: ROBAXIN   Travoprost (BAK Free) 0.004 % Soln ophthalmic solution Commonly known as: TRAVATAN       TAKE these medications    acetaminophen  500 MG tablet Commonly known as: TYLENOL  Take 1,000 mg by mouth daily as needed for mild pain (pain score 1-3) or moderate pain (  pain score 4-6).   albuterol  108 (90 Base) MCG/ACT inhaler Commonly known as: VENTOLIN  HFA Inhale 1-2 puffs into the lungs every 6 (six) hours as needed for wheezing or shortness of breath.   aspirin EC 81 MG tablet Take 81 mg by mouth daily. Swallow  whole.   atorvastatin  40 MG tablet Commonly known as: LIPITOR Take 40 mg by mouth at bedtime.   CALCIUM  PO Take 1 tablet by mouth daily.   cholecalciferol  25 MCG (1000 UNIT) tablet Commonly known as: VITAMIN D3 Take 1,000 Units by mouth daily.   finasteride 5 MG tablet Commonly known as: PROSCAR Take 5 mg by mouth daily.   ipratropium-albuterol  0.5-2.5 (3) MG/3ML Soln Commonly known as: DUONEB Inhale 3 mLs into the lungs in the morning, at noon, and at bedtime.   levothyroxine 25 MCG tablet Commonly known as: SYNTHROID Take 25 mcg by mouth every morning.   losartan  100 MG tablet Commonly known as: COZAAR  Take 100 mg by mouth daily.   MAGNESIUM PO Take 1 tablet by mouth daily.   metoprolol  tartrate 25 MG tablet Commonly known as: LOPRESSOR  Take 25 mg by mouth daily.   predniSONE  20 MG tablet Commonly known as: DELTASONE  Take 2 tablets (40 mg total) by mouth daily with breakfast for 3 days. Start taking on: March 22, 2024   sildenafil 100 MG tablet Commonly known as: VIAGRA Take 100 mg by mouth daily as needed (prostate issues per DIL).   tamsulosin 0.4 MG Caps capsule Commonly known as: FLOMAX Take 0.4 mg by mouth at bedtime.        Follow-up Information     Thelbert Eva SAUNDERS, DO. Schedule an appointment as soon as possible for a visit in 1 week(s).   Specialty: Family Medicine Contact information: [address] [place] [address] [place]  [place] [address] [phone]         [facility]. Schedule an appointment as soon as possible for a visit.                 Discharge Exam:   Subjective: Patient seen with his wife. He was up in a chair.  Reported mild SOB not significantly worse than his baseline with chronic cough at baseline.  He and his wife are comfortable with dc today.  He had mild delirium overnight and they prefer to be at home.   Objective: Vitals:   03/21/24 1300 03/21/24  1405  BP:  (!) 133/59  Pulse:  69  Resp: (!) 21 (!) 25  Temp:  97.7 F (36.5 C)  SpO2:  100%    Intake/Output Summary (Last 24 hours) at 03/21/2024 1514 Last data filed at 03/21/2024 1400 Gross per 24 hour  Intake 720 ml  Output 1500 ml  Net -780 ml   Filed Weights   03/19/24 1941  Weight: 79.8 kg    Exam:  General:  Appears calm and comfortable and is in NAD, on RA Eyes:  normal lids, iris ENT:  grossly normal hearing, lips & tongue, mmm Cardiovascular:  RRR, no m/r/g. No LE edema.  Respiratory:   CTA bilaterally with no wheezes/rales/rhonchi.  Mildly increased respiratory effort. Abdomen:  soft, NT, ND Skin:  no rash or induration seen on limited exam Musculoskeletal:  grossly normal tone BUE/BLE, good ROM, no bony abnormality Psychiatric:  grossly normal mood and affect, speech fluent and appropriate, AOx2-3 Neurologic:  CN 2-12 grossly intact, moves all extremities in coordinated fashion  Data Reviewed:  I have reviewed the patient's lab results since admission.  Pertinent labs for today include:   Na++ 127, improved CO2 19, stable Glucose 133 BUN 20/Creatinine 1.26/GFR 55, stable WBC 7.8 Hgb 9.2 Platelets 144    Condition at discharge: improving  The results of significant diagnostics from this hospitalization (including imaging, microbiology, ancillary and laboratory) are listed below for reference.   Imaging Studies: DG Swallowing Func-Speech Pathology Result Date: 03/21/2024 Table formatting from the original result was not included. Modified Barium Swallow Study Patient Details Name: Craig Werner MRN: [id number] Date of Birth: 01-10-1934 Today's Date: 03/21/2024 HPI/PMH: HPI: 89yo male admitted 03/19/24 with SOB, cough, weakness x1wk. PMH: non-Hodgkin's lymphoma, prostate and kidney cancer, CAD, HTN, CKD, COPD, MI x2, PNA. CXR: no active disease, CTChest: no abnormality, mild scarring and bronchiectasis. Clinical Impression: Clinical Impression: Pt  presents with functional oropharyngeal swallow, without significant post-swallow residue or aspiration. Deep trace penetration of large consecutive boluses of thin liquid via cup, seen only one time during the last swallow of consecutive boluses. No penetration on individual sips of thin liquid via cup or straw despite multiple presentatins. Trace vallecular residue seen intermittently on thin liquids only. Dry swallow effectively cleared residue. No oral or pharyngeal residue of puree or solid textures. Esophageal sweep revealed it to clear thoroughly. Recommend continuing with current diet, eating slowly, taking small bites/sips. Upright position during PO intake also recommended. Following this study, SLP provided education to pt/wife, RN/MD at bedside. No further ST intervention recommended at this time. Plase reconsult if need arise. Factors that may increase risk of adverse event in presence of aspiration Noe & Lianne 2021):  n/a Recommendations/Plan: Swallowing Evaluation Recommendations Swallowing Evaluation Recommendations Recommendations: PO diet PO Diet Recommendation: Regular; Thin liquids (Level 0) Liquid Administration via: Cup; Straw Medication Administration: Whole meds with liquid Supervision: Patient able to self-feed Swallowing strategies  : Minimize environmental distractions; Slow rate; Small bites/sips Postural changes: Position pt fully upright for meals Oral care recommendations: Oral care BID (2x/day) Treatment Plan Treatment Plan Treatment recommendations: No treatment recommended at this time Follow-up recommendations: No SLP follow up Functional status assessment: Patient has not had a recent decline in their functional status. Recommendations Recommendations for follow up therapy are one component of a multi-disciplinary discharge planning process, led by the attending physician.  Recommendations may be updated based on patient status, additional functional criteria and insurance  authorization. Assessment: Orofacial Exam: Orofacial Exam Oral Cavity: Oral Hygiene: WFL Oral Cavity - Dentition: Dentures, top; Missing dentition Orofacial Anatomy: WFL Oral Motor/Sensory Function: WFL Anatomy: Anatomy: WFL Boluses Administered: Boluses Administered Boluses Administered: Thin liquids (Level 0); Puree; Solid; Mildly thick liquids (Level 2, nectar thick) Oral Impairment Domain: Oral Impairment Domain Lip Closure: No labial escape Tongue control during bolus hold: Cohesive bolus between tongue to palatal seal Bolus preparation/mastication: Timely and efficient chewing and mashing Bolus transport/lingual motion: Brisk tongue motion Oral residue: Complete oral clearance Location of oral residue : N/A Initiation of pharyngeal swallow : Valleculae; Pyriform sinuses  Pharyngeal Impairment Domain: Pharyngeal Impairment Domain Soft palate elevation: No bolus between soft palate (SP)/pharyngeal wall (PW) Laryngeal elevation: Complete superior movement of thyroid cartilage with complete approximation of arytenoids to epiglottic petiole Anterior hyoid excursion: Complete anterior movement Epiglottic movement: Complete inversion Laryngeal vestibule closure: Complete, no air/contrast in laryngeal vestibule Pharyngeal stripping wave : Present - complete Pharyngeal contraction (A/P view only): N/A Pharyngoesophageal segment opening: Complete distension and complete duration, no obstruction of flow Tongue base retraction: No contrast between tongue base  and posterior pharyngeal wall (PPW) Pharyngeal residue: Trace residue within or on pharyngeal structures (thin liquid only) Location of pharyngeal residue: Valleculae  Esophageal Impairment Domain: Esophageal Impairment Domain Esophageal clearance upright position: Complete clearance, esophageal coating Pill: Not administered Penetration/Aspiration Scale Score: Penetration/Aspiration Scale Score 1.  Material does not enter airway: Thin liquids (Level 0); Puree; Solid  2.  Material enters airway, remains ABOVE vocal cords then ejected out: Thin liquids (Level 0) (via large consecutive boluses) Compensatory Strategies: Compensatory Strategies Compensatory strategies: Yes Multiple swallows: Effective Effective Multiple Swallows: Thin liquid (Level 0)  General Information: Caregiver present: No  Diet Prior to this Study: Regular; Thin liquids (Level 0)   Temperature : Normal   Respiratory Status: WFL   Supplemental O2: None (Room air)   History of Recent Intubation: No  Behavior/Cognition: Alert; Cooperative; Pleasant mood Self-Feeding Abilities: Able to self-feed Baseline vocal quality/speech: Normal Volitional Cough: Able to elicit Volitional Swallow: Able to elicit Exam Limitations: No limitations Goal Planning: Prognosis for improved oropharyngeal function: Good No data recorded No data recorded Patient/Family Stated Goal: none stated Consulted and agree with results and recommendations: Patient; Family member/caregiver; Physician; Nurse Pain: Pain Assessment Pain Assessment: No/denies pain End of Session: Start Time:SLP Start Time (ACUTE ONLY): 1200 Stop Time: SLP Stop Time (ACUTE ONLY): 1225 Time Calculation:SLP Time Calculation (min) (ACUTE ONLY): 25 min Charges: SLP Evaluations $ SLP Speech Visit: 1 Visit SLP Evaluations $BSS Swallow: 1 Procedure $MBS Swallow: 1 Procedure SLP visit diagnosis: SLP Visit Diagnosis: Dysphagia, pharyngeal phase (R13.13) Past Medical History: Past Medical History: Diagnosis Date  Arthritis   Asthma   Cancer (HCC)   prostate cancer; kidney cancer  Chronic kidney disease   kidney cancer; Left kidney removed  Coronary artery disease   Hypertension   Myocardial infarction (HCC)   x 2   Non Hodgkin's lymphoma (HCC)   Pneumonia  Past Surgical History: Past Surgical History: Procedure Laterality Date  APPENDECTOMY    CARDIAC CATHETERIZATION    x 3 stents  EYE SURGERY Right   right eye sx.  JOINT REPLACEMENT Right   3 joint replacements  LUMBAR  LAMINECTOMY/DECOMPRESSION MICRODISCECTOMY N/A 08/15/2019  Procedure: Lumbar four-five Laminectomy/Foraminotomy;  Surgeon: Debby Dorn MATSU, MD;  Location: [facility] OR;  Service: Neurosurgery;  Laterality: N/A;  NEPHRECTOMY Left   POSTERIOR CERVICAL FUSION/FORAMINOTOMY N/A 12/25/2019  Procedure: POSTERIOR CERVICAL DECOMPRESSION AND FUSION CERVICAL 7- THORACIC 1;  Surgeon: Debby Dorn MATSU, MD;  Location: [facility] OR;  Service: Neurosurgery;  Laterality: N/A;  TONSILLECTOMY   Celia B. Dory, MSP, CCC-SLP Speech Language Pathologist Dory Caprice Daring 03/21/2024, 12:59 PM  CT Chest Wo Contrast Result Date: 03/19/2024 CLINICAL DATA:  Persistent cough and weakness EXAM: CT CHEST WITHOUT CONTRAST TECHNIQUE: Multidetector CT imaging of the chest was performed following the standard protocol without IV contrast. RADIATION DOSE REDUCTION: This exam was performed according to the departmental dose-optimization program which includes automated exposure control, adjustment of the mA and/or kV according to patient size and/or use of iterative reconstruction technique. COMPARISON:  Chest x-ray 03/19/2024, PET CT 12/18/2023, chest CT 10/19/2015 FINDINGS: Cardiovascular: Limited evaluation without intravenous contrast. Moderate severe aortic atherosclerosis. No aneurysm. Multi vessel coronary vascular calcification. Normal cardiac size. No pericardial effusion Mediastinum/Nodes: Patent trachea. Left thyroid nodule measuring 2 cm. This has been evaluated on previous imaging. (ref: J Am Coll Radiol. 2015 Feb;12(2): 143-50). No suspicious lymph nodes. Esophagus within normal limits Lungs/Pleura: Elevation of the right diaphragm. No pleural effusion or pneumothorax. Mild subpleural reticulation. Mild bilateral bronchiectasis. Bilateral bronchial  wall thickening. No focal airspace disease. Small pulmonary nodules, largest in the left upper lobe measuring 5 mm on series 3, image 82, stable compared to prior, no specific imaging follow-up is  recommended Upper Abdomen: Gallstones.  No acute finding Musculoskeletal: No acute osseous abnormality IMPRESSION: 1. No CT evidence for acute intrathoracic abnormality. 2. Mild subpleural reticulation consistent with mild scarring. Mild bronchiectasis. Bilateral bronchial wall thickening suggesting airways inflammatory process, but no focal airspace disease. 3. Gallstones. 4. Aortic atherosclerosis. Aortic Atherosclerosis (ICD10-I70.0). Electronically Signed   By: Luke Bun M.D.   On: 03/19/2024 22:23   DG Chest Portable 1 View Result Date: 03/19/2024 CLINICAL DATA:  Shortness of breath EXAM: PORTABLE CHEST 1 VIEW COMPARISON:  02/13/2024 FINDINGS: Cardiac shadow is stable. Aortic calcifications are noted. The lungs are well aerated bilaterally. No focal infiltrate or effusion is seen. Postsurgical changes are noted in the cervical spine. No acute bony abnormality is noted. IMPRESSION: No active disease. Electronically Signed   By: Oneil Devonshire M.D.   On: 03/19/2024 20:46    Microbiology: Results for orders placed or performed during the hospital encounter of 03/19/24  Resp panel by RT-PCR (RSV, Flu A&B, Covid) Anterior Nasal Swab     Status: None   Collection Time: 03/19/24  9:19 PM   Specimen: Anterior Nasal Swab  Result Value Ref Range Status   SARS Coronavirus 2 by RT PCR NEGATIVE NEGATIVE Final    Comment: (NOTE) SARS-CoV-2 target nucleic acids are NOT DETECTED.  The SARS-CoV-2 RNA is generally detectable in upper respiratory specimens during the acute phase of infection. The lowest concentration of SARS-CoV-2 viral copies this assay can detect is 138 copies/mL. A negative result does not preclude SARS-Cov-2 infection and should not be used as the sole basis for treatment or other patient management decisions. A negative result may occur with  improper specimen collection/handling, submission of specimen other than nasopharyngeal swab, presence of viral mutation(s) within the areas  targeted by this assay, and inadequate number of viral copies(<138 copies/mL). A negative result must be combined with clinical observations, patient history, and epidemiological information. The expected result is Negative.  Fact Sheet for Patients:  BloggerCourse.com  Fact Sheet for Healthcare Providers:  SeriousBroker.it  This test is no t yet approved or cleared by the [place]  FDA and  has been authorized for detection and/or diagnosis of SARS-CoV-2 by FDA under an Emergency Use Authorization (EUA). This EUA will remain  in effect (meaning this test can be used) for the duration of the COVID-19 declaration under Section 564(b)(1) of the Act, 21 U.S.C.section 360bbb-3(b)(1), unless the authorization is terminated  or revoked sooner.       Influenza A by PCR NEGATIVE NEGATIVE Final   Influenza B by PCR NEGATIVE NEGATIVE Final    Comment: (NOTE) The Xpert Xpress SARS-CoV-2/FLU/RSV plus assay is intended as an aid in the diagnosis of influenza from Nasopharyngeal swab specimens and should not be used as a sole basis for treatment. Nasal washings and aspirates are unacceptable for Xpert Xpress SARS-CoV-2/FLU/RSV testing.  Fact Sheet for Patients: BloggerCourse.com  Fact Sheet for Healthcare Providers: SeriousBroker.it  This test is not yet approved or cleared by the [place]  FDA and has been authorized for detection and/or diagnosis of SARS-CoV-2 by FDA under an Emergency Use Authorization (EUA). This EUA will remain in effect (meaning this test can be used) for the duration of the COVID-19 declaration under Section 564(b)(1) of the Act, 21 U.S.C. section 360bbb-3(b)(1), unless the authorization is terminated  or revoked.     Resp Syncytial Virus by PCR NEGATIVE NEGATIVE Final    Comment: (NOTE) Fact Sheet for  Patients: BloggerCourse.com  Fact Sheet for Healthcare Providers: SeriousBroker.it  This test is not yet approved or cleared by the [place]  FDA and has been authorized for detection and/or diagnosis of SARS-CoV-2 by FDA under an Emergency Use Authorization (EUA). This EUA will remain in effect (meaning this test can be used) for the duration of the COVID-19 declaration under Section 564(b)(1) of the Act, 21 U.S.C. section 360bbb-3(b)(1), unless the authorization is terminated or revoked.  Performed at [facility], [address] [place], [place] [address]     Labs: CBC: Recent Labs  Lab 03/19/24 1953 03/21/24 0541  WBC 7.2 7.8  NEUTROABS 4.8  --   HGB 10.0* 9.2*  HCT 29.8* 28.7*  MCV 89.2 91.4  PLT 166 144*   Basic Metabolic Panel: Recent Labs  Lab 03/19/24 1953 03/20/24 1441 03/20/24 2135 03/21/24 0541  NA 123* 124* 124* 127*  K 4.7 4.6 4.3 4.6  CL 92* 95* 95* 98  CO2 19* 17* 19* 19*  GLUCOSE 111* 125* 156* 133*  BUN 17 16 21 20   CREATININE 1.23 1.22 1.34* 1.26*  CALCIUM  9.6 9.0 9.0 9.3   Liver Function Tests: Recent Labs  Lab 03/19/24 1953  AST 16  ALT 10  ALKPHOS 67  BILITOT 0.7  PROT 8.8*  ALBUMIN 3.5   CBG: No results for input(s): GLUCAP in the last 168 hours.  Discharge time spent: greater than 30 minutes.  Signed: Delon Herald, MD [facility] 03/21/2024

## 2024-03-21 NOTE — Hospital Course (Signed)
 89yo with h/o NHL not on treatment, CAD, HTN, stage 3a CKD, ETOH use d/o, and COPD not on home O2 who presented with cough and wheezing.  He was diagnosed with COPD exacerbation and started on steroids and nebs.

## 2024-03-21 NOTE — Evaluation (Signed)
 Clinical/Bedside Swallow Evaluation Patient Details  Name: Craig Werner MRN: [id number] Date of Birth: [birth year]  Today's Date: 03/21/2024 Time: SLP Start Time (ACUTE ONLY): 0900 SLP Stop Time (ACUTE ONLY): 0925 SLP Time Calculation (min) (ACUTE ONLY): 25 min  Past Medical History:  Past Medical History:  Diagnosis Date   Arthritis    Asthma    Cancer (HCC)    prostate cancer; kidney cancer   Chronic kidney disease    kidney cancer; Left kidney removed   Coronary artery disease    Hypertension    Myocardial infarction (HCC)    x 2    Non Hodgkin's lymphoma (HCC)    Pneumonia    Past Surgical History:  Past Surgical History:  Procedure Laterality Date   APPENDECTOMY     CARDIAC CATHETERIZATION     x 3 stents   EYE SURGERY Right    right eye sx.   JOINT REPLACEMENT Right    3 joint replacements   LUMBAR LAMINECTOMY/DECOMPRESSION MICRODISCECTOMY N/A 08/15/2019   Procedure: Lumbar four-five Laminectomy/Foraminotomy;  Surgeon: Debby Dorn MATSU, MD;  Location: [facility] OR;  Service: Neurosurgery;  Laterality: N/A;   NEPHRECTOMY Left    POSTERIOR CERVICAL FUSION/FORAMINOTOMY N/A 12/25/2019   Procedure: POSTERIOR CERVICAL DECOMPRESSION AND FUSION CERVICAL 7- THORACIC 1;  Surgeon: Debby Dorn MATSU, MD;  Location: [facility] OR;  Service: Neurosurgery;  Laterality: N/A;   TONSILLECTOMY     HPI:  89yo male admitted 03/19/24 with SOB, cough, weakness x1wk. PMH: non-Hodgkin's lymphoma, prostate and kidney cancer, CAD, HTN, CKD, COPD, MI x2, PNA. CXR: no active disease, CTChest: no abnormality, mild scarring and bronchiectasis.    Assessment / Plan / Recommendation  Clinical Impression  Pt presents with functional oropharyngeal swallow, as best can be determined at bedside. Pt has upper partial, missing lower molars. Pt tolerated regular solids and thin liquids prior to admit with recent intermittent choking episodes reported. Pt also with PNA x2 recently. CN exam unremarkable. Pt tolerated  trials of thin liquid, puree, and solid textures without obvious oral issues or overt s/s aspiration. Pt passed 3oz water challenge without s/s aspiration.   Given COPD, recurrent PNA, and recent choking reported, instrumental assessment is recommended to more objectively assess swallow physiology and safety. Scheduled with radiology for 1200 this date. Pt/family and medical team informed.  SLP Visit Diagnosis: Dysphagia, unspecified (R13.10)    Aspiration Risk  Mild aspiration risk    Diet Recommendation Regular;Thin liquid    Liquid Administration via: Cup;Straw Medication Administration: Whole meds with liquid Supervision: Patient able to self feed Compensations: Minimize environmental distractions;Slow rate;Small sips/bites (moisten particulate items (eggs, vegetables, etc)) Postural Changes: Seated upright at 90 degrees;Remain upright for at least 30 minutes after po intake    Other  Recommendations Oral Care Recommendations: Oral care BID     Assistance Recommended at Discharge  TBD  Functional Status Assessment Patient has had a recent decline in their functional status and demonstrates the ability to make significant improvements in function in a reasonable and predictable amount of time.   Frequency and Duration  (pending MBS results)          Prognosis Prognosis for improved oropharyngeal function: Good      Swallow Study   General Date of Onset: 03/19/24 HPI: 89yo male admitted 03/19/24 with SOB, cough, weakness x1wk. PMH: non-Hodgkin's lymphoma, prostate and kidney cancer, CAD, HTN, CKD, COPD, MI x2, PNA. CXR: no active disease, CTChest: no abnormality, mild scarring and bronchiectasis. Type of Study: Bedside Swallow  Evaluation Previous Swallow Assessment: none Diet Prior to this Study: Regular;Thin liquids (Level 0) Temperature Spikes Noted: No Respiratory Status: Room air History of Recent Intubation: No Behavior/Cognition: Alert;Cooperative;Pleasant mood Oral  Cavity Assessment: Within Functional Limits Oral Care Completed by SLP: Yes Oral Cavity - Dentition: Dentures, top;Missing dentition Vision: Functional for self-feeding Self-Feeding Abilities: Able to feed self Patient Positioning: Upright in chair Baseline Vocal Quality: Normal Volitional Cough: Strong Volitional Swallow: Able to elicit    Oral/Motor/Sensory Function Overall Oral Motor/Sensory Function: Within functional limits   Ice Chips Ice chips: Not tested   Thin Liquid Thin Liquid: Within functional limits Presentation: Cup;Self Fed;Straw    Nectar Thick Nectar Thick Liquid: Not tested   Honey Thick Honey Thick Liquid: Not tested   Puree Puree: Within functional limits Presentation: Spoon   Solid     Solid: Within functional limits Presentation: Self Fed     Nico Rogness B. Dory, MSP, CCC-SLP Speech Language Pathologist  Dory Caprice Daring 03/21/2024,9:40 AM

## 2024-03-21 NOTE — Progress Notes (Signed)
SATURATION QUALIFICATIONS: (This note is used to comply with regulatory documentation for home oxygen)  Patient Saturations on Room Air at Rest = 96%  Patient Saturations on Room Air while Ambulating = 90%  Patient Saturations on na Liters of oxygen while Ambulating = na%  Please briefly explain why patient needs home oxygen: 

## 2024-03-21 NOTE — Procedures (Signed)
 Modified Barium Swallow Study  Patient Details  Name: MARQUI FORMBY MRN: [id number] Date of Birth: 11/20/1933  Today's Date: 03/21/2024  Modified Barium Swallow completed.  Full report located under Chart Review in the Imaging Section.  History of Present Illness 89yo male admitted 03/19/24 with SOB, cough, weakness x1wk. PMH: non-Hodgkin's lymphoma, prostate and kidney cancer, CAD, HTN, CKD, COPD, MI x2, PNA. CXR: no active disease, CTChest: no abnormality, mild scarring and bronchiectasis.   Clinical Impression Pt presents with functional oropharyngeal swallow, without significant post-swallow residue or aspiration. Deep trace penetration of large consecutive boluses of thin liquid via cup, seen only one time during the last swallow of consecutive boluses. No penetration on individual sips of thin liquid via cup or straw despite multiple presentatins. Trace vallecular residue seen intermittently on thin liquids only. Dry swallow effectively cleared residue. No oral or pharyngeal residue of puree or solid textures. Esophageal sweep revealed it to clear thoroughly.   Recommend continuing with current diet, eating slowly, taking small bites/sips. Upright position during PO intake also recommended. Following this study, SLP provided education to pt/wife, RN/MD at bedside. No further ST intervention recommended at this time. Plase reconsult if need arise.  Factors that may increase risk of adverse event in presence of aspiration Noe & Lianne 2021):  n/a  Swallow Evaluation Recommendations Recommendations: PO diet PO Diet Recommendation: Regular;Thin liquids (Level 0) Liquid Administration via: Cup;Straw Medication Administration: Whole meds with liquid Supervision: Patient able to self-feed Swallowing strategies  : Minimize environmental distractions;Slow rate;Small bites/sips Postural changes: Position pt fully upright for meals Oral care recommendations: Oral care BID  (2x/day)  Dayle Sherpa B. Dory, MSP, CCC-SLP Speech Language Pathologist  Dory Caprice Daring 03/21/2024,1:01 PM

## 2024-03-21 NOTE — Progress Notes (Signed)
   03/21/24 1052  Spiritual Encounters  Type of Visit Initial  Care provided to: Pt and family  Reason for visit Advance directives  OnCall Visit No   Responded to consult for HCPOA. Discussed with patient and spouse. Both indicated they are not interested as they have a POA. Explained the difference between POA and HCPOA. Spouse stated she doesn't think it would be necessary however they will discuss with their daughter in law. Left form with patient.

## 2024-03-21 NOTE — Plan of Care (Signed)
  Problem: Education: Goal: Knowledge of General Education information will improve Description: Including pain rating scale, medication(s)/side effects and non-pharmacologic comfort measures Outcome: Progressing   Problem: Health Behavior/Discharge Planning: Goal: Ability to manage health-related needs will improve Outcome: Progressing   Problem: Nutrition: Goal: Adequate nutrition will be maintained Outcome: Progressing   Problem: Elimination: Goal: Will not experience complications related to bowel motility Outcome: Progressing Goal: Will not experience complications related to urinary retention Outcome: Progressing   Problem: Pain Managment: Goal: General experience of comfort will improve and/or be controlled Outcome: Progressing   Problem: Safety: Goal: Ability to remain free from injury will improve Outcome: Progressing   Problem: Skin Integrity: Goal: Risk for impaired skin integrity will decrease Outcome: Progressing   Problem: Clinical Measurements: Goal: Respiratory complications will improve Outcome: Not Progressing   Problem: Activity: Goal: Risk for activity intolerance will decrease Outcome: Not Progressing   Problem: Coping: Goal: Level of anxiety will decrease Outcome: Not Progressing

## 2024-03-21 NOTE — TOC Initial Note (Addendum)
 Transition of Care [facility]) - Initial/Assessment Note    Patient Details  Name: Craig Werner MRN: [id number] Date of Birth: 12-15-1933  [facility] CM/SW Contact:    Sonda Manuella Quill, RN Phone Number: 03/21/2024, 3:36 PM  Clinical Narrative:                 IP CM consulted for SA counseling/education; spoke w/ pt, wife Craig Werner, and dtr Craig Werner [phone]) in room; pt HOH; questions answered by dtr; pt lives at home w/ his spouse; they plan for him to return at d/c, and his wife will provide transportation; insurance/PCP verified; pt's dtr denied pt experiencing SDOH risks; pt has cane, walker, and power chair; he does not have HH services, or home oxygen; Ms Werner declined receiving resources for SA counseling/education; spoke w/ April Marsa, [place] coordinator; she said pt is seen at [place], [place] by  PCP Dr Rudell Ernst; his SW is Nat Bird [phone], ext [phone]); no IP CM needs.  Expected Discharge Plan: Home/Self Care Barriers to Discharge: No Barriers Identified   Patient Goals and CMS Choice Patient states their goals for this hospitalization and ongoing recovery are:: home          Expected Discharge Plan and Services   Discharge Planning Services: CM Consult Post Acute Care Choice: NA Living arrangements for the past 2 months: Single Family Home Expected Discharge Date: 03/21/24               DME Arranged: N/A DME Agency: NA       HH Arranged: NA HH Agency: NA        Prior Living Arrangements/Services Living arrangements for the past 2 months: Single Family Home Lives with:: Spouse Patient language and need for interpreter reviewed:: Yes Do you feel safe going back to the place where you live?: Yes      Need for Family Participation in Patient Care: Yes (Comment) Care giver support system in place?: Yes (comment) Current home services: DME (cane, walker, power chair) Criminal Activity/Legal Involvement Pertinent to Current  Situation/Hospitalization: No - Comment as needed  Activities of Daily Living   ADL Screening (condition at time of admission) Independently performs ADLs?: No Does the patient have a NEW difficulty with bathing/dressing/toileting/self-feeding that is expected to last >3 days?: No Does the patient have a NEW difficulty with getting in/out of bed, walking, or climbing stairs that is expected to last >3 days?: No Does the patient have a NEW difficulty with communication that is expected to last >3 days?: No Is the patient deaf or have difficulty hearing?: Yes Does the patient have difficulty seeing, even when wearing glasses/contacts?: No Does the patient have difficulty concentrating, remembering, or making decisions?: No  Permission Sought/Granted Permission sought to share information with : Case Manager Permission granted to share information with : Yes, Verbal Permission Granted  Share Information with NAME: Case Manager     Permission granted to share info w Relationship: Craig Werner (dtr) [phone]     Emotional Assessment Appearance:: Appears stated age Attitude/Demeanor/Rapport: Unable to Assess Affect (typically observed): Unable to Assess Orientation: :  (unable to assess) Alcohol / Substance Use: Not Applicable Psych Involvement: No (comment)  Admission diagnosis:  Shortness of breath [R06.02] Hyponatremia [E87.1] COPD exacerbation (HCC) [J44.1] Patient Active Problem List   Diagnosis Date Noted   Chronic hyponatremia 03/21/2024   Chronic kidney disease, stage 3a (HCC) 03/21/2024   CAD (coronary artery disease) 03/21/2024   Essential hypertension 03/21/2024  Alcohol use 03/21/2024   NHL (non-Hodgkin's lymphoma) (HCC) 03/21/2024   Normocytic anemia 03/21/2024   Hypothyroidism 03/21/2024   BPH (benign prostatic hyperplasia) 03/21/2024   COPD exacerbation (HCC) 03/19/2024   Stenosis of cervical spine with myelopathy (HCC) 12/25/2019   PCP:  Thelbert Eva SAUNDERS,  DO Pharmacy:   [facility] [phone] - [place], [place] - [address], [address] AT [facility] [address], [address] [place] [address] Phone: [phone] Fax: [phone]     Social Drivers of Health (SDOH) Social History: SDOH Screenings   Food Insecurity: No Food Insecurity (03/21/2024)  Housing: Low Risk  (03/21/2024)  Transportation Needs: No Transportation Needs (03/21/2024)  Utilities: Not At Risk (03/21/2024)  Social Connections: Moderately Isolated (03/20/2024)  Tobacco Use: Medium Risk (03/19/2024)   SDOH Interventions: Food Insecurity Interventions: Intervention Not Indicated, Inpatient TOC Housing Interventions: Intervention Not Indicated, Inpatient TOC Transportation Interventions: Intervention Not Indicated, Inpatient TOC Utilities Interventions: Intervention Not Indicated, Inpatient TOC   Readmission Risk Interventions    03/21/2024    3:32 PM  Readmission Risk Prevention Plan  Transportation Screening Complete  PCP or Specialist Appt within 5-7 Days Complete  Home Care Screening Complete  Medication Review (RN CM) Complete
